# Patient Record
Sex: Female | Born: 1980 | Race: Black or African American | Hispanic: No | Smoking: Never smoker
Health system: Southern US, Community
[De-identification: ages and names within clinical notes are randomized; demographics above are authoritative.]

## PROBLEM LIST (undated history)

## (undated) DIAGNOSIS — Z8619 Personal history of other infectious and parasitic diseases: Secondary | ICD-10-CM

## (undated) DIAGNOSIS — D259 Leiomyoma of uterus, unspecified: Secondary | ICD-10-CM

## (undated) DIAGNOSIS — N946 Dysmenorrhea, unspecified: Secondary | ICD-10-CM

## (undated) HISTORY — PX: DILATION AND CURETTAGE OF UTERUS: SHX78

## (undated) HISTORY — DX: Personal history of other infectious and parasitic diseases: Z86.19

## (undated) HISTORY — DX: Dysmenorrhea, unspecified: N94.6

## (undated) HISTORY — DX: Leiomyoma of uterus, unspecified: D25.9

---

## 2010-12-29 ENCOUNTER — Emergency Department: Payer: Self-pay | Admitting: Unknown Physician Specialty

## 2011-01-15 ENCOUNTER — Emergency Department: Payer: Self-pay | Admitting: Emergency Medicine

## 2011-01-17 LAB — PATHOLOGY REPORT

## 2011-01-27 ENCOUNTER — Emergency Department: Payer: Self-pay | Admitting: Emergency Medicine

## 2011-06-06 ENCOUNTER — Emergency Department: Payer: Self-pay | Admitting: Emergency Medicine

## 2011-06-07 ENCOUNTER — Inpatient Hospital Stay: Payer: Self-pay | Admitting: Obstetrics and Gynecology

## 2011-06-11 LAB — PATHOLOGY REPORT

## 2011-06-30 ENCOUNTER — Ambulatory Visit: Payer: Self-pay | Admitting: Oncology

## 2011-07-01 ENCOUNTER — Ambulatory Visit: Payer: Self-pay | Admitting: Oncology

## 2011-07-26 ENCOUNTER — Ambulatory Visit: Payer: Self-pay | Admitting: Oncology

## 2011-09-10 ENCOUNTER — Ambulatory Visit: Payer: Self-pay | Admitting: Oncology

## 2011-09-18 ENCOUNTER — Ambulatory Visit: Payer: Self-pay | Admitting: Obstetrics and Gynecology

## 2011-09-18 LAB — BASIC METABOLIC PANEL
BUN: 5 mg/dL — ABNORMAL LOW (ref 7–18)
Co2: 27 mmol/L (ref 21–32)
EGFR (Non-African Amer.): >60
Glucose: 79 mg/dL (ref 65–99)
Osmolality: 276 (ref 275–301)
Potassium: 3.7 mmol/L (ref 3.5–5.1)

## 2011-09-18 LAB — HEMOGLOBIN: HGB: 12.8 g/dL (ref 12.0–16.0)

## 2011-09-26 ENCOUNTER — Ambulatory Visit: Payer: Self-pay | Admitting: Obstetrics and Gynecology

## 2011-09-26 ENCOUNTER — Ambulatory Visit: Payer: Self-pay | Admitting: Oncology

## 2011-10-18 ENCOUNTER — Ambulatory Visit: Payer: Self-pay | Admitting: Obstetrics and Gynecology

## 2012-01-29 ENCOUNTER — Emergency Department: Payer: Self-pay | Admitting: Emergency Medicine

## 2012-01-29 LAB — URINALYSIS, COMPLETE
Bacteria: NONE SEEN
Bilirubin,UR: NEGATIVE
Glucose,UR: NEGATIVE mg/dL (ref 0–75)
Leukocyte Esterase: NEGATIVE
Nitrite: NEGATIVE
Protein: NEGATIVE
Specific Gravity: 1.016 (ref 1.003–1.030)
Squamous Epithelial: 1

## 2012-01-29 LAB — CBC
HCT: 39.8 % (ref 35.0–47.0)
Platelet: 182 10*3/uL (ref 150–440)
RBC: 4.31 10*6/uL (ref 3.80–5.20)

## 2012-02-09 ENCOUNTER — Encounter: Payer: Self-pay | Admitting: Obstetrics and Gynecology

## 2012-02-16 ENCOUNTER — Emergency Department: Payer: Self-pay | Admitting: Emergency Medicine

## 2012-02-16 LAB — URINALYSIS, COMPLETE
Bilirubin,UR: NEGATIVE
Glucose,UR: NEGATIVE mg/dL (ref 0–75)
Ketone: NEGATIVE
Nitrite: NEGATIVE
Ph: 6 (ref 4.5–8.0)
Protein: 30
RBC,UR: 37 /HPF (ref 0–5)
Specific Gravity: 1.026 (ref 1.003–1.030)
Squamous Epithelial: 3
WBC UR: 4 /HPF (ref 0–5)

## 2012-02-16 LAB — CBC
HCT: 37.1 % (ref 35.0–47.0)
MCH: 31.2 pg (ref 26.0–34.0)
MCHC: 34 g/dL (ref 32.0–36.0)
MCV: 92 fL (ref 80–100)
Platelet: 168 10*3/uL (ref 150–440)
RDW: 13.4 % (ref 11.5–14.5)
WBC: 8.5 10*3/uL (ref 3.6–11.0)

## 2012-02-16 LAB — HCG, QUANTITATIVE, PREGNANCY: Beta Hcg, Quant.: 35482 m[IU]/mL — ABNORMAL HIGH

## 2012-04-25 ENCOUNTER — Observation Stay: Payer: Self-pay

## 2012-04-25 LAB — URINALYSIS, COMPLETE
Bilirubin,UR: NEGATIVE
Blood: NEGATIVE
Nitrite: NEGATIVE
Ph: 7 (ref 4.5–8.0)
Protein: NEGATIVE
RBC,UR: <1 /HPF (ref 0–5)
Squamous Epithelial: 3

## 2012-08-11 ENCOUNTER — Observation Stay: Payer: Self-pay | Admitting: Obstetrics and Gynecology

## 2012-08-19 ENCOUNTER — Inpatient Hospital Stay: Payer: Self-pay | Admitting: Obstetrics and Gynecology

## 2012-08-19 LAB — PIH PROFILE
Anion Gap: 10 (ref 7–16)
Calcium, Total: 9.1 mg/dL (ref 8.5–10.1)
Chloride: 107 mmol/L (ref 98–107)
Co2: 22 mmol/L (ref 21–32)
Creatinine: 0.65 mg/dL (ref 0.60–1.30)
EGFR (African American): >60
EGFR (Non-African Amer.): >60
Glucose: 86 mg/dL (ref 65–99)
HCT: 35.7 % (ref 35.0–47.0)
HGB: 11.9 g/dL — ABNORMAL LOW (ref 12.0–16.0)
MCHC: 33.4 g/dL (ref 32.0–36.0)
Osmolality: 275 (ref 275–301)
Platelet: 130 10*3/uL — ABNORMAL LOW (ref 150–440)
RBC: 3.91 10*6/uL (ref 3.80–5.20)
RDW: 15 % — ABNORMAL HIGH (ref 11.5–14.5)
SGOT(AST): 17 U/L (ref 15–37)
Uric Acid: 4.7 mg/dL (ref 2.6–6.0)
WBC: 7.9 10*3/uL (ref 3.6–11.0)

## 2013-05-29 ENCOUNTER — Emergency Department: Payer: Self-pay | Admitting: Emergency Medicine

## 2013-05-29 LAB — URINALYSIS, COMPLETE
Glucose,UR: NEGATIVE mg/dL (ref 0–75)
Protein: 100
RBC,UR: 380 /HPF (ref 0–5)
Specific Gravity: 1.028 (ref 1.003–1.030)
Squamous Epithelial: 65
WBC UR: 11 /HPF (ref 0–5)

## 2013-05-29 LAB — COMPREHENSIVE METABOLIC PANEL
Albumin: 3.7 g/dL (ref 3.4–5.0)
Bilirubin,Total: 0.3 mg/dL (ref 0.2–1.0)
Calcium, Total: 9.3 mg/dL (ref 8.5–10.1)
Chloride: 105 mmol/L (ref 98–107)
Co2: 20 mmol/L — ABNORMAL LOW (ref 21–32)
EGFR (African American): >60
EGFR (Non-African Amer.): >60
Osmolality: 274 (ref 275–301)
Potassium: 3.3 mmol/L — ABNORMAL LOW (ref 3.5–5.1)
SGOT(AST): 33 U/L (ref 15–37)
SGPT (ALT): 44 U/L (ref 12–78)
Sodium: 137 mmol/L (ref 136–145)

## 2013-05-29 LAB — CBC
HCT: 38.7 % (ref 35.0–47.0)
HGB: 13.3 g/dL (ref 12.0–16.0)
MCHC: 34.3 g/dL (ref 32.0–36.0)
MCV: 89 fL (ref 80–100)
Platelet: 199 10*3/uL (ref 150–440)

## 2013-05-29 LAB — LIPASE, BLOOD: Lipase: 70 U/L — ABNORMAL LOW (ref 73–393)

## 2013-11-19 IMAGING — US US OB < 14 WEEKS - US OB TV
1 series · 13 of 28 positions shown · non-contrast
Comparison: none

REASON FOR EXAM: left pelvic pain vaginal bleeding
COMMENTS:

PROCEDURE:     US  - US OB LESS THAN 14 WEEKS/W TRANS  - January 29, 2012  [DATE]
RESULT:     Emergent Pelvic Sonogram utilizing an early OB protocol is
performed.

[Series 1: us ob < 14 weeks - us ob tv · 0.25mm/px · 47 acquisitions, 13 frames shown]
[im 2/47]
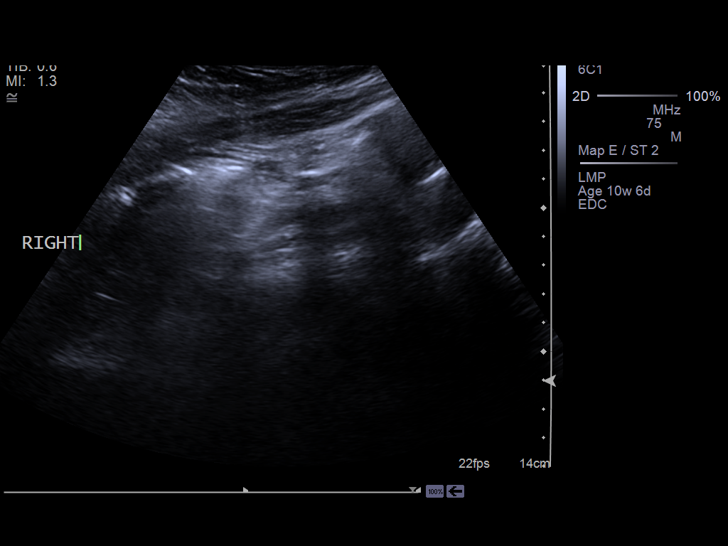
[im 6/47]
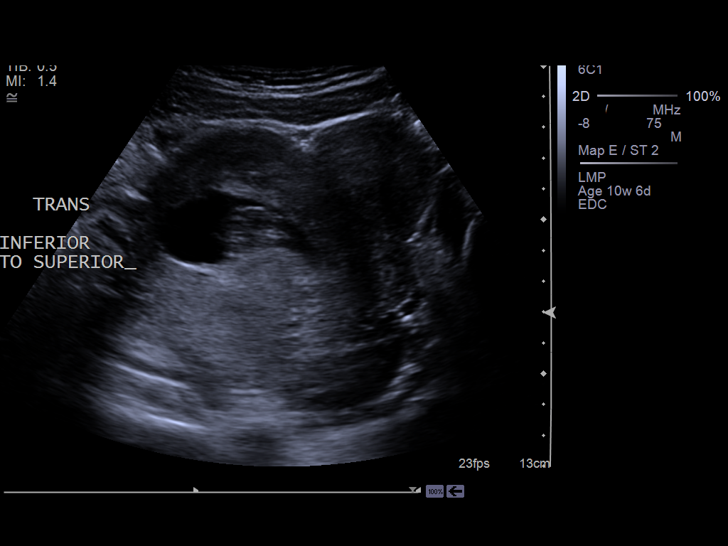
[im 9/47]
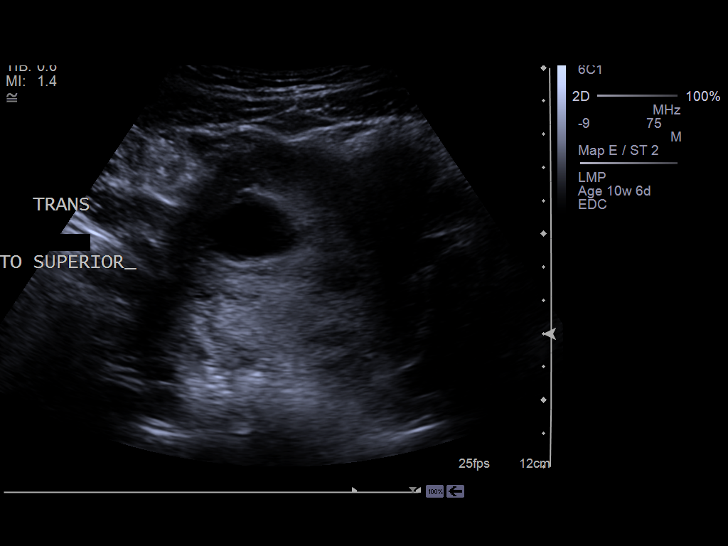
[im 12/47]
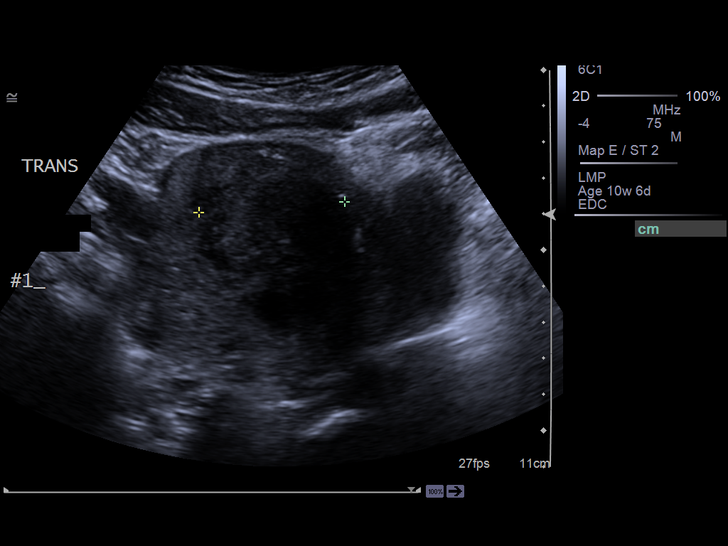
[im 16/47]
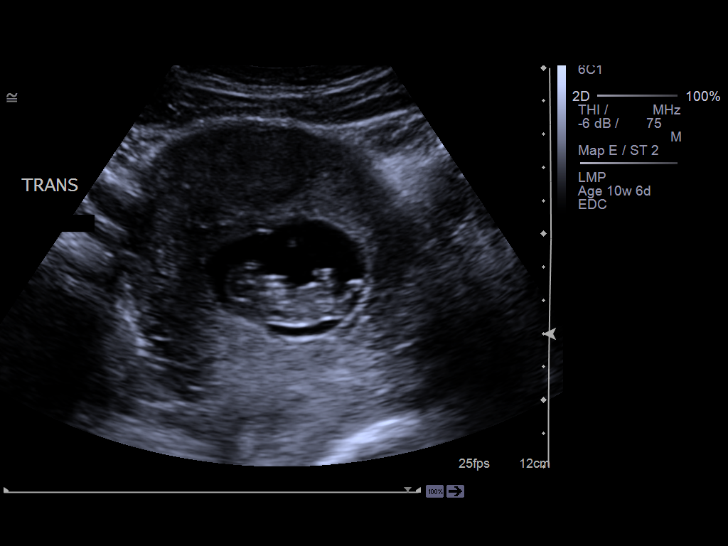
[im 19/47]
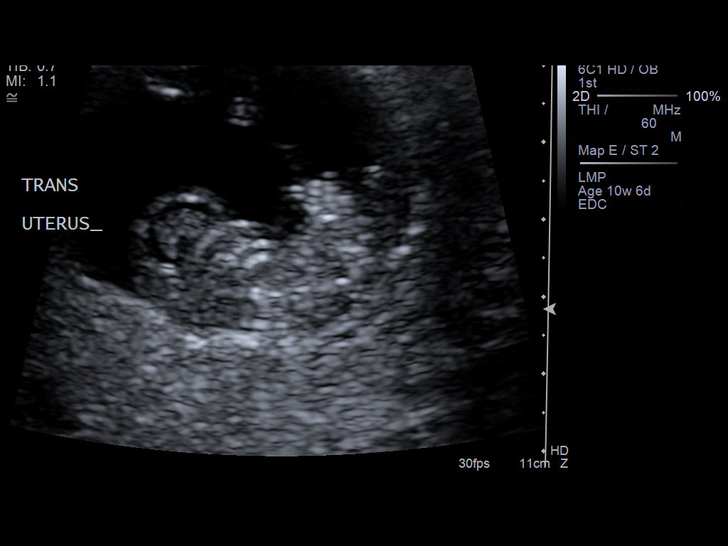
[im 24/47]
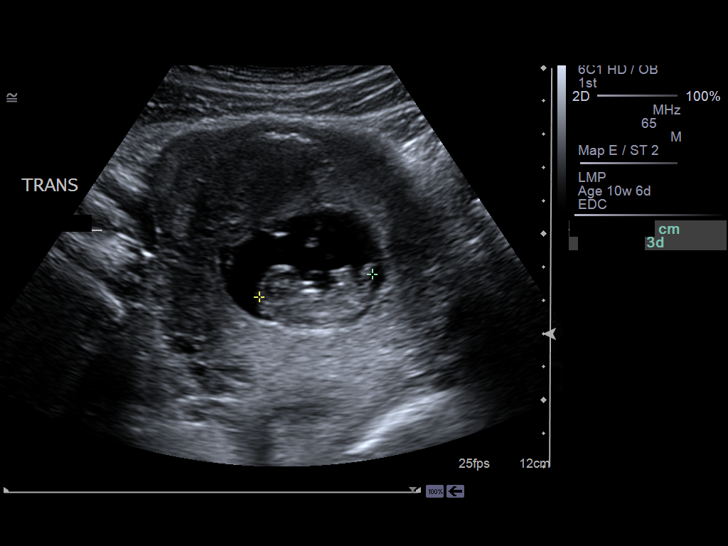
[im 28/47]
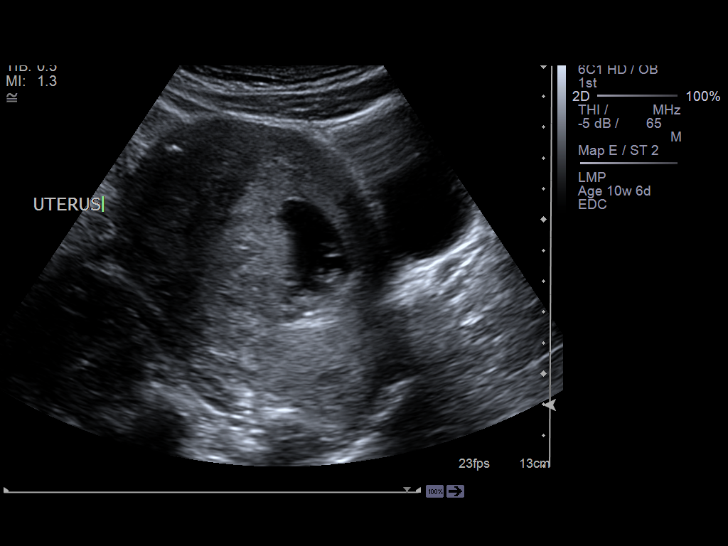
[im 31/47]
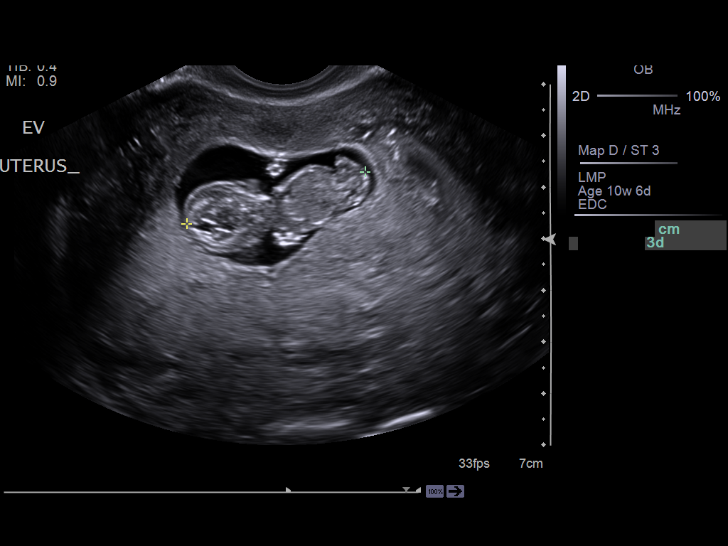
[im 35/47]
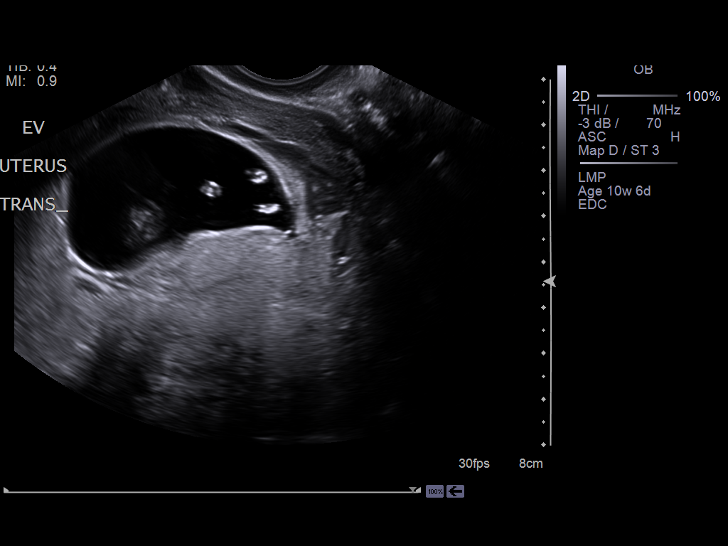
[im 38/47]
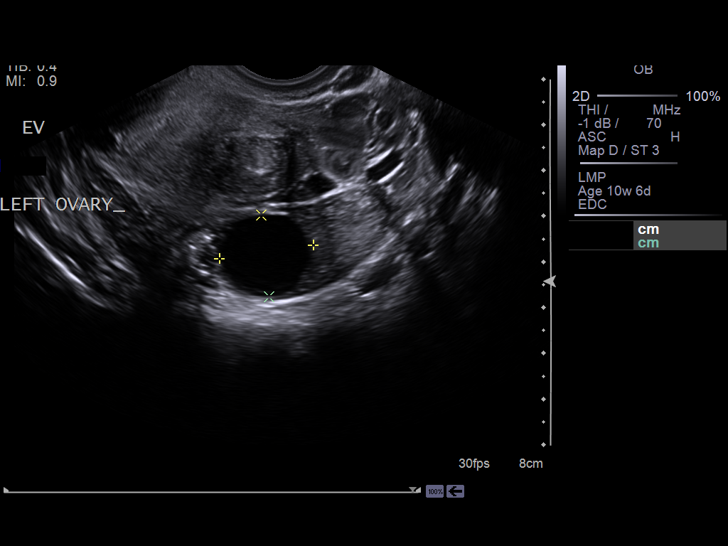
[im 41/47]
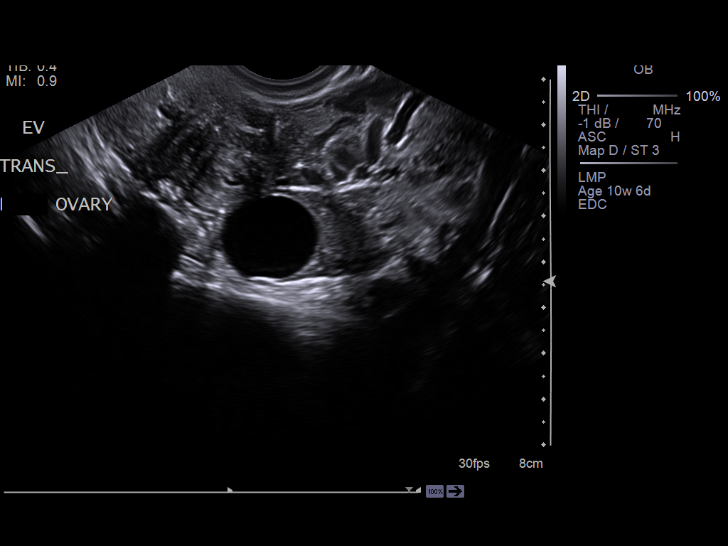
[im 45/47]
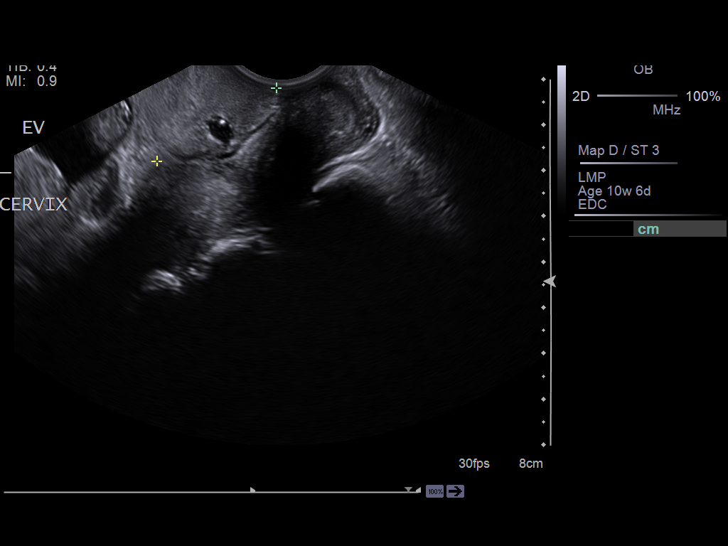

[13 of 28 positions shown; findings below may reference images not displayed]

FINDINGS: Images demonstrate an intrauterine gestational sac with evidence
of an intrauterine fetus. There is an abnormal area of echogenicity within
the uterus consistent with a large fibroid measuring up to 4.05 x 4.25 x
3.15 cm along the fundal region. In the left side of the uterus, there is a
larger fibroid measuring up to 4.77 x 3.80 x 4.0 cm. Fetal cardiac activity
is present with a rate of approximately 160 beats per minute. There is a
hypoechoic collection noted adjacent to the gestational sac consistent with
a small subchorionic hematoma measuring approximately 3.13 x 0.51 x 2.24 cm.
There is a left ovarian cyst of up to 2.1 cm. The right ovary is
unremarkable. There is no free fluid. Nabothian cyst is noted incidentally
in the cervix. Blood flow is documented to both ovaries. Crown-rump length
shows a mean measurement of 3.65 cm consistent with a 10 week, 3 day
gestation. Ultrasound EDC is 23 August, 2012.
IMPRESSION: 1.  Single, viable intrauterine fetus with estimated gestational age of 10
weeks, 3 days. Fetal cardiac activity is present.
2.  There is a small subchorionic hematoma present.
3.  Multiple uterine fibroids are present.
4.  Simple appearing cyst in the left ovary is present.

[REDACTED]

## 2014-01-22 ENCOUNTER — Emergency Department: Payer: Self-pay | Admitting: Emergency Medicine

## 2014-09-03 ENCOUNTER — Emergency Department: Payer: Self-pay | Admitting: Emergency Medicine

## 2014-12-15 NOTE — Discharge Summary (Signed)
PATIENT NAME:  Elizabeth Bridges, Elizabeth Bridges DATE OF BIRTH:  1981/07/25  ADMISSION DIAGNOSES:    This really does not require discharge; this is routine stuff, but she was admitted for [name],  induced and appears to have had an uncomplicated delivery with an uncomplicated postpartum course, not requiring medications to control  blood pressure at discharge.     ____________________________ Audry Pili L. Amalia Hailey, MD rle:ct D: 09/01/2012 08:08:00 ET T: 09/01/2012 10:17:58 ET JOB#: [id number]  cc: Ricky L. Amalia Hailey, MD, <Dictator> Selmer Dominion MD ELECTRONICALLY SIGNED 09/03/2012 17:43

## 2014-12-17 NOTE — Op Note (Signed)
PATIENT NAME:  Elizabeth Bridges, Elizabeth Bridges MR#:  [id number] DATE OF BIRTH:  [birth year]  DATE OF PROCEDURE:  09/26/2011  PREOPERATIVE DIAGNOSIS: Submucosal fibroid.   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   PROCEDURE: Hysteroscopic evaluation of the endometrial cavity.   SURGEON: Boykin Nearing, MD  ANESTHESIA: General endotracheal anesthesia.   INDICATIONS: A 34 year old gravida 3, para 1 patient with a history of 15 week second trimester fetal loss. Saline infusion sonohysterography demonstrates two separate submucosal fibroids.   DESCRIPTION OF PROCEDURE: After adequate general endotracheal anesthesia, patient was placed in dorsal supine position, legs in the candy cane stirrups. Lower abdomen, perineal and vaginal prep performed with Betadine. Patient was sterilely draped. Patient did receive 2 grams IV cefoxitin prior to commencement of the case. Weighted speculum placed in the posterior vaginal vault after draining the bladder with a Red Robinson catheter yielding 100 mL urine. Anterior cervix grasped with a single-tooth tenaculum. Uterus is irregularly shaped, midplane. The cervix was dilated to #20 Hanks dilator without difficulty. The resectoscope advanced into the endometrial cavity without difficulty with 1.5% glycine used as distending medium. Upon entry into the endometrial cavity the right ostia and horn was visualized. There seemed to be a thickened midline septum and the left ostia was not identified. The endometrial cavity was luteal phase and thickened. Multiple attempts to identify left ostia unsuccessful. 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 Procedure was terminated. Total glycine use 1300 mL, net remained within the patient 270 mL glycine. No complications. Patient tolerated procedure well. Procedure was terminated. Single-tooth tenaculum removed  from the anterior cervix. No bleeding. Good hemostasis noted.  ____________________________ Boykin Nearing, MD tjs:cms D: 09/26/2011 08:45:31 ET T: 09/26/2011 12:06:53 ET  JOB#: [id number] Boykin Nearing MD ELECTRONICALLY SIGNED 10/01/2011 9:24

## 2015-01-02 NOTE — H&P (Signed)
L&D Evaluation:  History:   HPI [id number] Obese BF EDC 08/20/12    Presents with elevated BP 151/96 at office    Patient's Medical History No Chronic Illness    Patient's Surgical History D&C    Medications Pre Natal Vitamins    Allergies NKDA    Social History none    Family History Non-Contributory   ROS:   ROS All systems were reviewed.  HEENT, CNS, GI, GU, Respiratory, CV, Renal and Musculoskeletal systems were found to be normal., specifically denies H/A, N/V, visual changes   Exam:   Vital Signs stable    General no apparent distress    Abdomen gravid, non-tender    Estimated Fetal Weight Average for gestational age    Back no CVAT    Reflexes 1+    Pelvic 4/80/-1, VTX    Mebranes AROM clear    FHT normal rate with no decels    Ucx absent   Impression:   Impression PIH   Plan:   Comments induce preeclampsia labs walk for 1 hour after 15 min EFM post-rupture Pit if no increase UC's after 1 hr   Electronic Signatures: Edison Nasuti (MD)  (Signed 26-Dec-13 17:54)  Authored: L&D Evaluation   Last Updated: 26-Dec-13 17:54 by Edison Nasuti (MD)

## 2015-01-02 NOTE — H&P (Signed)
L&D Evaluation:  History:   HPI 34yo [id number] with LMP of 11/14/11 & EDD of 08/20/12 with [place] at [facility] significant for "pain in in the lower pelvic area which feels like a 6 on 1-10 scale". Pt states the "pain started yest afternoon and felt like a dull pain and now feels more like a sharp pain". Denies any UC's, decreased FM, Vag bleeding, pain has not resolved and pt is concerned as she had a fetal loss in Oct 2012.    Presents with pelvic pain    Patient's Medical History Uterine fibroids x 2, ASCUs, subchorionic hematoma, 15 week loss, Protein S def,    Patient's Surgical History Lap surgery    Medications Pre Natal Vitamins    Allergies NKDA    Social History none    Family History Non-Contributory   ROS:   ROS All systems were reviewed.  HEENT, CNS, GI, GU, Respiratory, CV, Renal and Musculoskeletal systems were found to be normal.   Exam:   Vital Signs stable    General no apparent distress    Mental Status clear    Chest clear    Heart normal sinus rhythm, no murmur/gallop/rubs    Abdomen gravid, non-tender    Estimated Fetal Weight Average for gestational age    Back no CVAT    Edema 1+    Reflexes 1+    Clonus negative    Pelvic cervix closed and thick    Mebranes Intact    FHT normal rate with no decels    Ucx absent    Skin dry    Lymph no lymphadenopathy   Impression:   Impression IUP at 23 6/7 weeks w/ lower pelvic pain   Plan:   Plan Cont to observe.    Comments Urine is ess neg with 1+ luke esterase. Will do urine C&S.   Electronic Signatures: Catheryn Bacon (CNM)  (Signed 01-Sep-13 22:21)  Authored: L&D Evaluation   Last Updated: 01-Sep-13 22:21 by Catheryn Bacon (CNM)

## 2016-01-29 ENCOUNTER — Ambulatory Visit (INDEPENDENT_AMBULATORY_CARE_PROVIDER_SITE_OTHER): Payer: BLUE CROSS/BLUE SHIELD | Admitting: Obstetrics and Gynecology

## 2016-01-29 ENCOUNTER — Encounter: Payer: Self-pay | Admitting: Obstetrics and Gynecology

## 2016-01-29 VITALS — BP 104/70 | HR 76 | Ht 65.0 in | Wt 216.8 lb

## 2016-01-29 DIAGNOSIS — Z113 Encounter for screening for infections with a predominantly sexual mode of transmission: Secondary | ICD-10-CM | POA: Diagnosis not present

## 2016-01-29 DIAGNOSIS — D259 Leiomyoma of uterus, unspecified: Secondary | ICD-10-CM

## 2016-01-29 DIAGNOSIS — E669 Obesity, unspecified: Secondary | ICD-10-CM

## 2016-01-29 DIAGNOSIS — Z01419 Encounter for gynecological examination (general) (routine) without abnormal findings: Secondary | ICD-10-CM

## 2016-01-29 DIAGNOSIS — N920 Excessive and frequent menstruation with regular cycle: Secondary | ICD-10-CM | POA: Diagnosis not present

## 2016-01-29 LAB — POCT URINALYSIS DIPSTICK
BILIRUBIN UA: NEGATIVE
Glucose, UA: NEGATIVE
KETONES UA: NEGATIVE
Nitrite, UA: NEGATIVE
PH UA: 6
Urobilinogen, UA: NEGATIVE

## 2016-01-29 MED ORDER — DROSPIRENONE-ETHINYL ESTRADIOL 3-0.03 MG PO TABS: 1.0000 | ORAL_TABLET | Freq: Every day | ORAL | Status: DC

## 2016-01-29 NOTE — Patient Instructions (Signed)
Health Maintenance, Female Adopting a healthy lifestyle and getting preventive care can go a long way to promote health and wellness. Talk with your health care provider about what schedule of regular examinations is right for you. This is a good chance for you to check in with your provider about disease prevention and staying healthy. In between checkups, there are plenty of things you can do on your own. Experts have done a lot of research about which lifestyle changes and preventive measures are most likely to keep you healthy. Ask your health care provider for more information. WEIGHT AND DIET  Eat a healthy diet  Be sure to include plenty of vegetables, fruits, low-fat dairy products, and lean protein.  Do not eat a lot of foods high in solid fats, added sugars, or salt.  Get regular exercise. This is one of the most important things you can do for your health.  Most adults should exercise for at least 150 minutes each week. The exercise should increase your heart rate and make you sweat (moderate-intensity exercise).  Most adults should also do strengthening exercises at least twice a week. This is in addition to the moderate-intensity exercise.  Maintain a healthy weight  Body mass index (BMI) is a measurement that can be used to identify possible weight problems. It estimates body fat based on height and weight. Your health care provider can help determine your BMI and help you achieve or maintain a healthy weight.  For females 20 years of age and older:   A BMI below 18.5 is considered underweight.  A BMI of 18.5 to 24.9 is normal.  A BMI of 25 to 29.9 is considered overweight.  A BMI of 30 and above is considered obese.  Watch levels of cholesterol and blood lipids  You should start having your blood tested for lipids and cholesterol at 35 years of age, then have this test every 5 years.  You may need to have your cholesterol levels checked more often if:  Your lipid  or cholesterol levels are high.  You are older than 35 years of age.  You are at high risk for heart disease.  CANCER SCREENING   Lung Cancer  Lung cancer screening is recommended for adults 55-80 years old who are at high risk for lung cancer because of a history of smoking.  A yearly low-dose CT scan of the lungs is recommended for people who:  Currently smoke.  Have quit within the past 15 years.  Have at least a 30-pack-year history of smoking. A pack year is smoking an average of one pack of cigarettes a day for 1 year.  Yearly screening should continue until it has been 15 years since you quit.  Yearly screening should stop if you develop a health problem that would prevent you from having lung cancer treatment.  Breast Cancer  Practice breast self-awareness. This means understanding how your breasts normally appear and feel.  It also means doing regular breast self-exams. Let your health care provider know about any changes, no matter how small.  If you are in your 20s or 30s, you should have a clinical breast exam (CBE) by a health care provider every 1-3 years as part of a regular health exam.  If you are 40 or older, have a CBE every year. Also consider having a breast X-ray (mammogram) every year.  If you have a family history of breast cancer, talk to your health care provider about genetic screening.  If you   are at high risk for breast cancer, talk to your health care provider about having an MRI and a mammogram every year.  Breast cancer gene (BRCA) assessment is recommended for women who have family members with BRCA-related cancers. BRCA-related cancers include:  Breast.  Ovarian.  Tubal.  Peritoneal cancers.  Results of the assessment will determine the need for genetic counseling and BRCA1 and BRCA2 testing. Cervical Cancer Your health care provider may recommend that you be screened regularly for cancer of the pelvic organs (ovaries, uterus, and  vagina). This screening involves a pelvic examination, including checking for microscopic changes to the surface of your cervix (Pap test). You may be encouraged to have this screening done every 3 years, beginning at age 21.  For women ages 30-65, health care providers may recommend pelvic exams and Pap testing every 3 years, or they may recommend the Pap and pelvic exam, combined with testing for human papilloma virus (HPV), every 5 years. Some types of HPV increase your risk of cervical cancer. Testing for HPV may also be done on women of any age with unclear Pap test results.  Other health care providers may not recommend any screening for nonpregnant women who are considered low risk for pelvic cancer and who do not have symptoms. Ask your health care provider if a screening pelvic exam is right for you.  If you have had past treatment for cervical cancer or a condition that could lead to cancer, you need Pap tests and screening for cancer for at least 20 years after your treatment. If Pap tests have been discontinued, your risk factors (such as having a new sexual partner) need to be reassessed to determine if screening should resume. Some women have medical problems that increase the chance of getting cervical cancer. In these cases, your health care provider may recommend more frequent screening and Pap tests. Colorectal Cancer  This type of cancer can be detected and often prevented.  Routine colorectal cancer screening usually begins at 35 years of age and continues through 35 years of age.  Your health care provider may recommend screening at an earlier age if you have risk factors for colon cancer.  Your health care provider may also recommend using home test kits to check for hidden blood in the stool.  A small camera at the end of a tube can be used to examine your colon directly (sigmoidoscopy or colonoscopy). This is done to check for the earliest forms of colorectal  cancer.  Routine screening usually begins at age 50.  Direct examination of the colon should be repeated every 5-10 years through 35 years of age. However, you may need to be screened more often if early forms of precancerous polyps or small growths are found. Skin Cancer  Check your skin from head to toe regularly.  Tell your health care provider about any new moles or changes in moles, especially if there is a change in a mole's shape or color.  Also tell your health care provider if you have a mole that is larger than the size of a pencil eraser.  Always use sunscreen. Apply sunscreen liberally and repeatedly throughout the day.  Protect yourself by wearing long sleeves, pants, a wide-brimmed hat, and sunglasses whenever you are outside. HEART DISEASE, DIABETES, AND HIGH BLOOD PRESSURE   High blood pressure causes heart disease and increases the risk of stroke. High blood pressure is more likely to develop in:  People who have blood pressure in the high end   of the normal range (130-139/85-89 mm Hg).  People who are overweight or obese.  People who are African American.  If you are 38-23 years of age, have your blood pressure checked every 3-5 years. If you are 61 years of age or older, have your blood pressure checked every year. You should have your blood pressure measured twice--once when you are at a hospital or clinic, and once when you are not at a hospital or clinic. Record the average of the two measurements. To check your blood pressure when you are not at a hospital or clinic, you can use:  An automated blood pressure machine at a pharmacy.  A home blood pressure monitor.  If you are between 45 years and 39 years old, ask your health care provider if you should take aspirin to prevent strokes.  Have regular diabetes screenings. This involves taking a blood sample to check your fasting blood sugar level.  If you are at a normal weight and have a low risk for diabetes,  have this test once every three years after 35 years of age.  If you are overweight and have a high risk for diabetes, consider being tested at a younger age or more often. PREVENTING INFECTION  Hepatitis B  If you have a higher risk for hepatitis B, you should be screened for this virus. You are considered at high risk for hepatitis B if:  You were born in a country where hepatitis B is common. Ask your health care provider which countries are considered high risk.  Your parents were born in a high-risk country, and you have not been immunized against hepatitis B (hepatitis B vaccine).  You have HIV or AIDS.  You use needles to inject street drugs.  You live with someone who has hepatitis B.  You have had sex with someone who has hepatitis B.  You get hemodialysis treatment.  You take certain medicines for conditions, including cancer, organ transplantation, and autoimmune conditions. Hepatitis C  Blood testing is recommended for:  Everyone born from [birth year] through 1965.  Anyone with known risk factors for hepatitis C. Sexually transmitted infections (STIs)  You should be screened for sexually transmitted infections (STIs) including gonorrhea and chlamydia if:  You are sexually active and are younger than 35 years of age.  You are older than 35 years of age and your health care provider tells you that you are at risk for this type of infection.  Your sexual activity has changed since you were last screened and you are at an increased risk for chlamydia or gonorrhea. Ask your health care provider if you are at risk.  If you do not have HIV, but are at risk, it may be recommended that you take a prescription medicine daily to prevent HIV infection. This is called pre-exposure prophylaxis (PrEP). You are considered at risk if:  You are sexually active and do not regularly use condoms or know the HIV status of your partner(s).  You take drugs by injection.  You are sexually  active with a partner who has HIV. Talk with your health care provider about whether you are at high risk of being infected with HIV. If you choose to begin PrEP, you should first be tested for HIV. You should then be tested every 3 months for as long as you are taking PrEP.  PREGNANCY   If you are premenopausal and you may become pregnant, ask your health care provider about preconception counseling.  If you may  become pregnant, take 400 to 800 micrograms (mcg) of folic acid every day.  If you want to prevent pregnancy, talk to your health care provider about birth control (contraception). OSTEOPOROSIS AND MENOPAUSE   Osteoporosis is a disease in which the bones lose minerals and strength with aging. This can result in serious bone fractures. Your risk for osteoporosis can be identified using a bone density scan.  If you are 61 years of age or older, or if you are at risk for osteoporosis and fractures, ask your health care provider if you should be screened.  Ask your health care provider whether you should take a calcium or vitamin D supplement to lower your risk for osteoporosis.  Menopause may have certain physical symptoms and risks.  Hormone replacement therapy may reduce some of these symptoms and risks. Talk to your health care provider about whether hormone replacement therapy is right for you.  HOME CARE INSTRUCTIONS   Schedule regular health, dental, and eye exams.  Stay current with your immunizations.   Do not use any tobacco products including cigarettes, chewing tobacco, or electronic cigarettes.  If you are pregnant, do not drink alcohol.  If you are breastfeeding, limit how much and how often you drink alcohol.  Limit alcohol intake to no more than 1 drink per day for nonpregnant women. One drink equals 12 ounces of beer, 5 ounces of wine, or 1 ounces of hard liquor.  Do not use street drugs.  Do not share needles.  Ask your health care provider for help if  you need support or information about quitting drugs.  Tell your health care provider if you often feel depressed.  Tell your health care provider if you have ever been abused or do not feel safe at home.   This information is not intended to replace advice given to you by your health care provider. Make sure you discuss any questions you have with your health care provider.   Document Released: 02/24/2011 Document Revised: 09/01/2014 Document Reviewed: 07/13/2013 Elsevier Interactive Patient Education [employer].

## 2016-01-31 ENCOUNTER — Encounter: Payer: Self-pay | Admitting: Obstetrics and Gynecology

## 2016-01-31 NOTE — Progress Notes (Signed)
GYNECOLOGY ANNUAL PHYSICAL EXAM PROGRESS NOTE  Subjective:    Elizabeth Bridges is a 35 y.o. P65 female who presents for an annual exam. The patient has no complaints today. The patient is sexually active.  The patient wears seatbelts: yes. The patient participates in regular exercise: no. Has the patient ever been transfused or tattooed?: no. The patient reports that there is not domestic violence in her life.   The patient desires to discuss contraception.  Is currently on an OCP, but notes that it is no longer helping to control heavy menses due to fibroids.   Gynecologic History Menarche age: 43  Patient's last menstrual period was 01/22/2016 Contraception: OCP (estrogen/progesterone) History of STI's: Trichomoniasis, 2016. Last Pap: 10/2014. Results were: normal.  Denies h/o abnormal pap smears.   Obstetric History   G4   P2   T2   P0   A2   TAB0   SAB2   E0   M0   L3     # Outcome Date GA Lbr Len/2nd Weight Sex Delivery Anes PTL Lv  4 Term 2013   6 lb 4 oz (2.835 kg) M Vag-Spont     3 Term 2003   6 lb 12 oz (3.062 kg) F Vag-Spont   Y  2 SAB           1 SAB               Past Medical History  Diagnosis Date  . History of trichomoniasis   . Uterine fibroid   . Dysmenorrhea     Past Surgical History  Procedure Laterality Date  . Dilation and curettage of uterus      x 2    Family History  Problem Relation Age of Onset  . Asthma Mother   . Hypertension Father   . Heart disease Father   . Diabetes Father     Social History   Social History  . Marital Status: Single    Spouse Name: N/A  . Number of Children: N/A  . Years of Education: N/A   Occupational History  . Not on file.   Social History Main Topics  . Smoking status: Never Smoker   . Smokeless tobacco: Not on file  . Alcohol Use: No  . Drug Use: No  . Sexual Activity: Yes    Birth Control/ Protection: None   Other Topics Concern  . Not on file   Social History Narrative  . No  narrative on file    Outpatient Encounter Prescriptions as of 01/29/2016  Medication Sig Note  . ORSYTHIA 0.1-20 MG-MCG tablet Take 1 tablet by mouth daily. Reported on 01/29/2016 01/29/2016: Received from: External Pharmacy   No facility-administered encounter medications on file as of 01/29/2016.     No Known Allergies    Review of Systems Constitutional: negative for chills, fatigue, fevers and sweats Eyes: negative for irritation, redness and visual disturbance Ears, nose, mouth, throat, and face: negative for hearing loss, nasal congestion, snoring and tinnitus Respiratory: negative for asthma, cough, sputum Cardiovascular: negative for chest pain, dyspnea, exertional chest pressure/discomfort, irregular heart beat, palpitations and syncope Gastrointestinal: negative for abdominal pain, change in bowel habits, nausea and vomiting Genitourinary: positive for heavy menses (however regularly occuring).  Negative for genital lesions, sexual problems and vaginal discharge, dysuria and urinary incontinence Integument/breast: negative for breast lump, breast tenderness and nipple discharge Hematologic/lymphatic: negative for bleeding and easy bruising Musculoskeletal:negative for back pain and muscle weakness Neurological: negative  for dizziness, headaches, vertigo and weakness Endocrine: negative for diabetic symptoms including polydipsia, polyuria and skin dryness Allergic/Immunologic: negative for hay fever and urticaria       Objective:  Blood pressure 104/70, pulse 76, height 5\' 5"  (1.651 m), weight 216 lb 12.8 oz (98.34 kg), last menstrual period 01/22/2016. Body mass index is 36.08 kg/(m^2).  General Appearance:    Alert, cooperative, no distress, appears stated age, moderately obese  Head:    Normocephalic, without obvious abnormality, atraumatic  Eyes:    PERRL, conjunctiva/corneas clear, EOM's intact, both eyes  Ears:    Normal external ear canals, both ears  Nose:   Nares  normal, septum midline, mucosa normal, no drainage or sinus tenderness  Throat:   Lips, mucosa, and tongue normal; teeth and gums normal  Neck:   Supple, symmetrical, trachea midline, no adenopathy; thyroid: no enlargement/tenderness/nodules; no carotid bruit or JVD  Back:     Symmetric, no curvature, ROM normal, no CVA tenderness  Lungs:     Clear to auscultation bilaterally, respirations unlabored  Chest Wall:    No tenderness or deformity   Heart:    Regular rate and rhythm, S1 and S2 normal, no murmur, rub or gallop  Breast Exam:    No tenderness, masses, or nipple abnormality  Abdomen:     Soft, non-tender, bowel sounds active all four quadrants, no masses, no organomegaly.    Genitalia:    Pelvic:external genitalia normal, vagina without lesions, discharge, or tenderness, rectovaginal septum  normal. Cervix normal in appearance, no cervical motion tenderness, no adnexal masses or tenderness.  Uterus normal size, shape, mobile, regular contours, nontender.  Rectal:    Normal external sphincter.  No hemorrhoids appreciated. Internal exam not done.   Extremities:   Extremities normal, atraumatic, no cyanosis or edema  Pulses:   2+ and symmetric all extremities  Skin:   Skin color, texture, turgor normal, no rashes or lesions  Lymph nodes:   Cervical, supraclavicular, and axillary nodes normal  Neurologic:   CNII-XII intact, normal strength, sensation and reflexes throughout   .  Labs:  Lab Results  Component Value Date   WBC 12.4* 05/29/2013   HGB 13.3 05/29/2013   HCT 38.7 05/29/2013   MCV 89 05/29/2013   PLT 199 05/29/2013    Lab Results  Component Value Date   CREATININE 0.94 05/29/2013   BUN 8 05/29/2013   NA 137 05/29/2013   K 3.3* 05/29/2013   CL 105 05/29/2013   CO2 20* 05/29/2013    Lab Results  Component Value Date   ALT 44 05/29/2013   AST 33 05/29/2013   ALKPHOS 90 05/29/2013   BILITOT 0.3 05/29/2013    No results found for: TSH   Assessment:    Healthy female exam.  H/o fibroid uterus Heavy menses Obesity, Class II  Plan:    - Blood tests: CBC with diff, Comprehensive metabolic panel, Lipoproteins and Vitamin D levels. - Breast self exam technique reviewed and patient encouraged to perform self-exam monthly. - Contraception: OCP (estrogen/progesterone).  Will change from [place] to [place]. Patient at age 41, however can continue taking combined OCPs as she is not a smoker, no h/o HTN or CVD.  - Discussed healthy lifestyle modifications. - Pap smear performed today.  - Patient desires STI screening. Will perform.  - RTC in 1 year, or as needed.     Rubie Maid, MD [facility]

## 2016-02-01 ENCOUNTER — Other Ambulatory Visit: Payer: BLUE CROSS/BLUE SHIELD

## 2016-02-01 DIAGNOSIS — Z01419 Encounter for gynecological examination (general) (routine) without abnormal findings: Secondary | ICD-10-CM | POA: Diagnosis not present

## 2016-02-02 LAB — CBC WITH DIFFERENTIAL/PLATELET
BASOS: 0 %
Basophils Absolute: 0 10*3/uL (ref 0.0–0.2)
EOS (ABSOLUTE): 0.1 10*3/uL (ref 0.0–0.4)
Eos: 1 %
HEMATOCRIT: 35.8 % (ref 34.0–46.6)
Hemoglobin: 12.3 g/dL (ref 11.1–15.9)
Immature Grans (Abs): 0 10*3/uL (ref 0.0–0.1)
Immature Granulocytes: 0 %
LYMPHS ABS: 1.9 10*3/uL (ref 0.7–3.1)
Lymphs: 27 %
MCH: 29.8 pg (ref 26.6–33.0)
MCHC: 34.4 g/dL (ref 31.5–35.7)
MCV: 87 fL (ref 79–97)
MONOS ABS: 0.5 10*3/uL (ref 0.1–0.9)
Monocytes: 8 %
NEUTROS ABS: 4.3 10*3/uL (ref 1.4–7.0)
NEUTROS PCT: 64 %
PLATELETS: 244 10*3/uL (ref 150–379)
RBC: 4.13 x10E6/uL (ref 3.77–5.28)
RDW: 12.9 % (ref 12.3–15.4)
WBC: 6.9 10*3/uL (ref 3.4–10.8)

## 2016-02-02 LAB — COMPREHENSIVE METABOLIC PANEL
A/G RATIO: 1.2 (ref 1.2–2.2)
ALBUMIN: 3.8 g/dL (ref 3.5–5.5)
ALT: 19 IU/L (ref 0–32)
AST: 22 IU/L (ref 0–40)
Alkaline Phosphatase: 78 IU/L (ref 39–117)
BUN / CREAT RATIO: 14 (ref 9–23)
BUN: 10 mg/dL (ref 6–20)
Bilirubin Total: 0.3 mg/dL (ref 0.0–1.2)
CALCIUM: 9.5 mg/dL (ref 8.7–10.2)
CO2: 24 mmol/L (ref 18–29)
Chloride: 100 mmol/L (ref 96–106)
Creatinine, Ser: 0.74 mg/dL (ref 0.57–1.00)
GFR, EST AFRICAN AMERICAN: 121 mL/min/{1.73_m2} (ref >59)
GFR, EST NON AFRICAN AMERICAN: 105 mL/min/{1.73_m2} (ref >59)
GLOBULIN, TOTAL: 3.2 g/dL (ref 1.5–4.5)
Glucose: 84 mg/dL (ref 65–99)
POTASSIUM: 3.9 mmol/L (ref 3.5–5.2)
SODIUM: 141 mmol/L (ref 134–144)
TOTAL PROTEIN: 7 g/dL (ref 6.0–8.5)

## 2016-02-02 LAB — HEP, RPR, HIV PANEL
HIV Screen 4th Generation wRfx: NONREACTIVE
Hepatitis B Surface Ag: NEGATIVE
RPR: NONREACTIVE

## 2016-02-02 LAB — LIPID PANEL
CHOL/HDL RATIO: 3.1 ratio (ref 0.0–4.4)
Cholesterol, Total: 183 mg/dL (ref 100–199)
HDL: 59 mg/dL (ref >39)
LDL Calculated: 107 mg/dL — ABNORMAL HIGH (ref 0–99)
TRIGLYCERIDES: 85 mg/dL (ref 0–149)
VLDL Cholesterol Cal: 17 mg/dL (ref 5–40)

## 2016-02-02 LAB — VITAMIN D 25 HYDROXY (VIT D DEFICIENCY, FRACTURES): Vit D, 25-Hydroxy: 11.3 ng/mL — ABNORMAL LOW (ref 30.0–100.0)

## 2016-02-07 ENCOUNTER — Telehealth: Payer: Self-pay

## 2016-02-07 DIAGNOSIS — E559 Vitamin D deficiency, unspecified: Secondary | ICD-10-CM

## 2016-02-07 MED ORDER — VITAMIN D (ERGOCALCIFEROL) 1.25 MG (50000 UNIT) PO CAPS: 50000.0000 [IU] | ORAL_CAPSULE | ORAL | Status: DC

## 2016-02-07 NOTE — Telephone Encounter (Signed)
-----   Message from Rubie Maid, MD sent at 02/05/2016  6:53 PM EDT ----- Normal annual labs except:  1) LDL cholesterol slightly elevated.  Should engage in lifestyle changes (diet modification (low cholesterol foods) and exercise at least 3 x weekly).  2) Vit D levels very low, with Vit D deficiency.  Needs to begin Vit D 50,000 units weekly x 12 weeks. Then should begin daily OTC Vit D supplement.  Repeat levels in 3 months after initiation of treatment.

## 2016-02-07 NOTE — Telephone Encounter (Signed)
Called pt informed her of labs and prescription. RX sent in pt to follow up in 12wks.

## 2016-05-02 ENCOUNTER — Other Ambulatory Visit: Payer: BLUE CROSS/BLUE SHIELD

## 2016-05-02 ENCOUNTER — Other Ambulatory Visit: Payer: Self-pay | Admitting: Obstetrics and Gynecology

## 2016-05-02 DIAGNOSIS — E559 Vitamin D deficiency, unspecified: Secondary | ICD-10-CM | POA: Diagnosis not present

## 2016-05-03 LAB — VITAMIN D 25 HYDROXY (VIT D DEFICIENCY, FRACTURES): VIT D 25 HYDROXY: 34.8 ng/mL (ref 30.0–100.0)

## 2016-06-24 IMAGING — CR RIGHT FOOT COMPLETE - 3+ VIEW
1 series · 3 of 3 positions shown · non-contrast
Comparison: None.

CLINICAL DATA: Hit toes on dresser while catching son from falling
from bed this a.m..

EXAM:
RIGHT FOOT COMPLETE - 3+ VIEW

[Series 1: dxr foot rt complete w/obliques · 0.14mm/px · 3 of 3 slices shown]
[im 1/3]
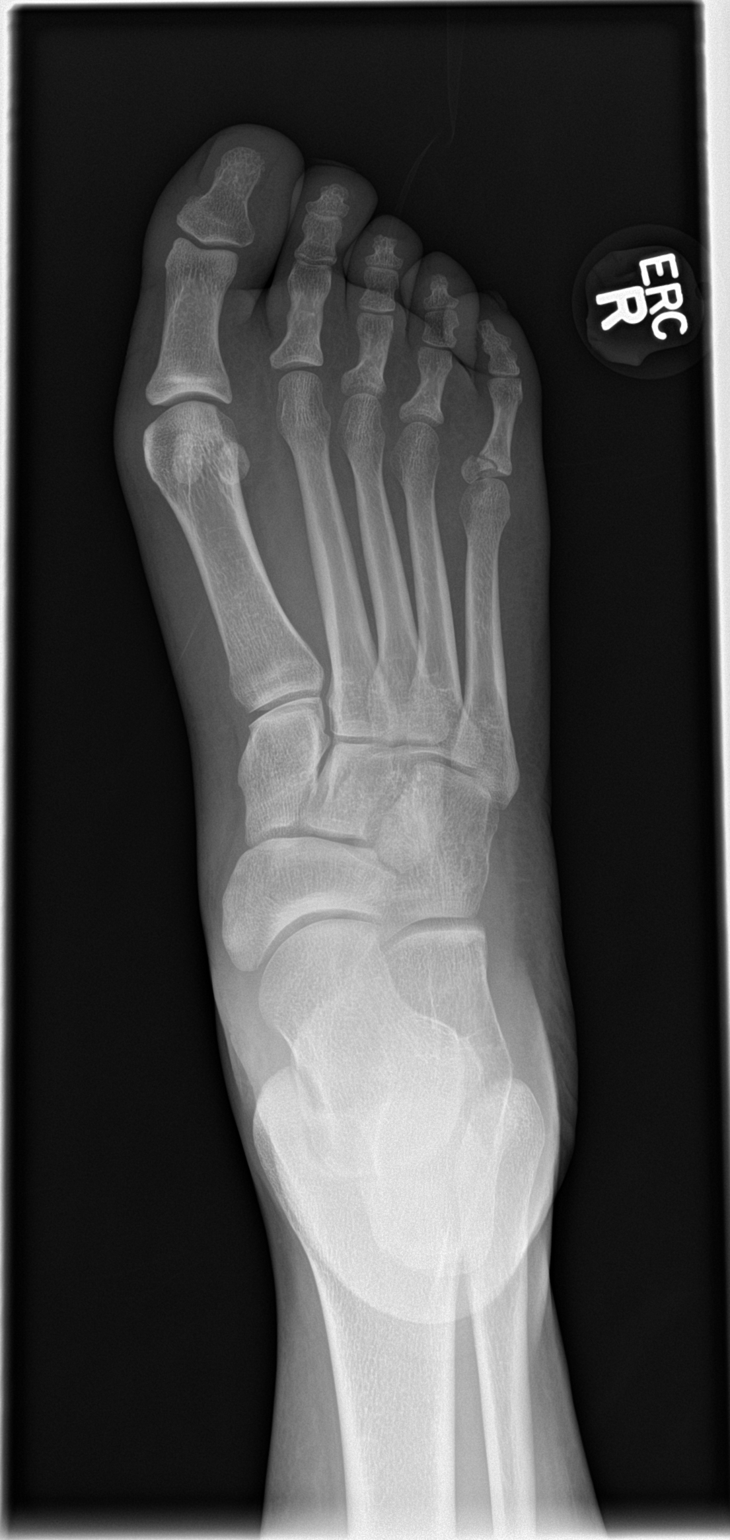
[im 2/3]
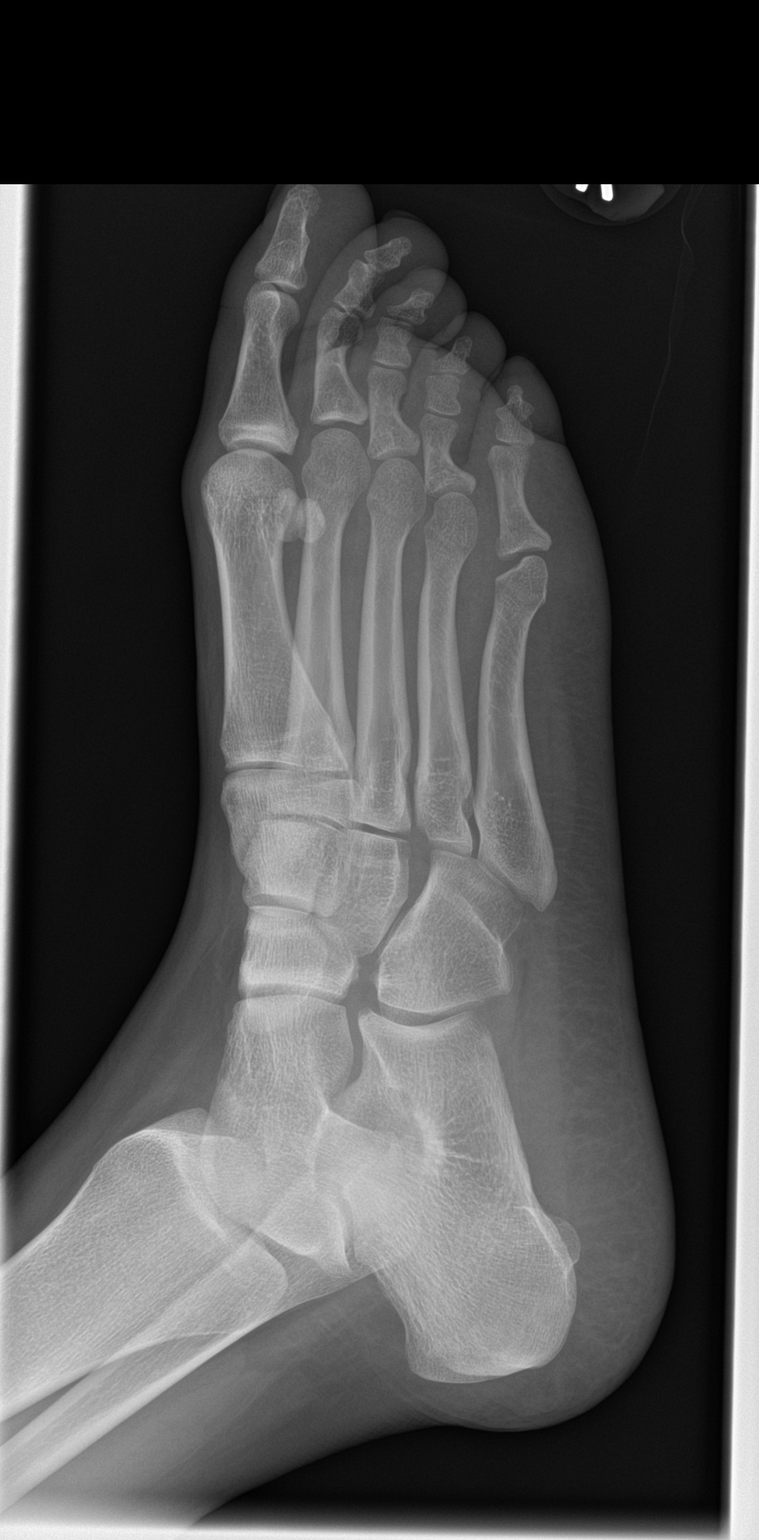
[im 3/3]
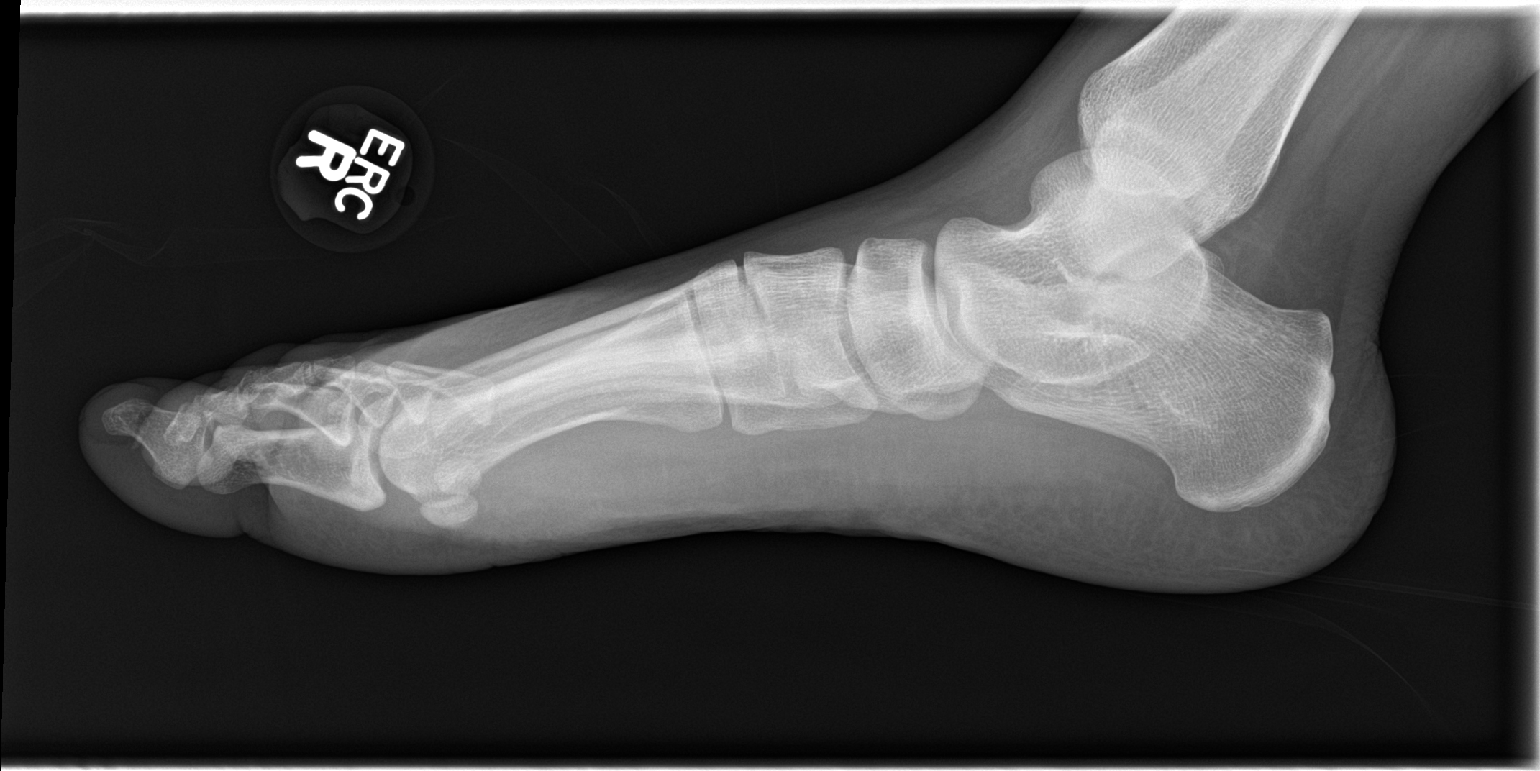

[3 of 3 positions shown; findings below may reference images not displayed]

FINDINGS: Oblique base of fifth proximal phalanx intra-articular fracture in
alignment. No dislocation. No destructive bony lesions. Mild hallux
valgus. Soft tissue planes are nonsuspicious.
IMPRESSION: Nondisplaced base of fifth proximal phalanx intra-articular fracture
without dislocation.

  By: Mirkov Tremel

## 2016-10-07 ENCOUNTER — Encounter: Payer: Self-pay | Admitting: Obstetrics and Gynecology

## 2016-10-07 ENCOUNTER — Ambulatory Visit (INDEPENDENT_AMBULATORY_CARE_PROVIDER_SITE_OTHER): Payer: BLUE CROSS/BLUE SHIELD | Admitting: Obstetrics and Gynecology

## 2016-10-07 VITALS — BP 101/71 | HR 83 | Ht 65.0 in | Wt 214.3 lb

## 2016-10-07 DIAGNOSIS — N921 Excessive and frequent menstruation with irregular cycle: Secondary | ICD-10-CM

## 2016-10-07 DIAGNOSIS — D259 Leiomyoma of uterus, unspecified: Secondary | ICD-10-CM

## 2016-10-07 DIAGNOSIS — R5383 Other fatigue: Secondary | ICD-10-CM | POA: Diagnosis not present

## 2016-10-07 MED ORDER — IBUPROFEN 800 MG PO TABS: 800.0000 mg | ORAL_TABLET | Freq: Three times a day (TID) | ORAL | 1 refills | Status: DC | PRN

## 2016-10-07 MED ORDER — NORGESTIMATE-ETH ESTRADIOL 0.25-35 MG-MCG PO TABS: 1.0000 | ORAL_TABLET | Freq: Every day | ORAL | 11 refills | Status: DC

## 2016-10-07 NOTE — Progress Notes (Signed)
    GYNECOLOGY PROGRESS NOTE  Subjective:    Patient ID: Elizabeth Bridges, female    DOB: 02-04-1981, 36 y.o.   MRN: [id number]  HPI  Patient is a 36 y.o. [phone] female with a h/o fibroid uterus who presents for complaints of breakthrough bleeding on OCPs.  Patient currently on [place].  States that she has bright pink or red spotting to light bleeding ~ 1 week after her period for the past several months. Denies missing any pills.  States that she has bright pink or red spotting to light bleeding ~ 1 week after her period for the past several months. Denies missing any pills.     The following portions of the patient's history were reviewed and updated as appropriate: allergies, current medications, past family history, past medical history, past social history, past surgical history and problem list.  Review of Systems A comprehensive review of systems was negative except for: Constitutional: positive for fatigue   Objective:   Blood pressure 101/71, pulse 83, height 5\' 5"  (1.651 m), weight 214 lb 4.8 oz (97.2 kg), last menstrual period 09/28/2016. General appearance: alert and no distress Abdomen: soft, non-tender; bowel sounds normal; no masses,  no organomegaly Pelvic: deferred   Assessment:   Breakthrough bleeding on OCPs H/o fibroids Fatigue  Plan:   - Discussion had on management options, including changing OCPs, starting on NSAIDs the week that she notes bleeding/potting. Patient ok with plan.  Will change from [facility] to [place].   - Will order pelvic ultrasound to reassess fibroid uterus to look for potential fibroid growth. Last imaging was in - 2014 (CT scan to assess renal stones, with incidental finding of fibroids).  - Will check CBC and TSH to evaluate potential cause of fatigue.   Will contact patient with results of utlrasound, and arrange for further f/u as needed.    Rubie Maid, MD [facility]

## 2016-10-08 LAB — CBC
HEMATOCRIT: 39.1 % (ref 34.0–46.6)
HEMOGLOBIN: 12.9 g/dL (ref 11.1–15.9)
MCH: 29.1 pg (ref 26.6–33.0)
MCHC: 33 g/dL (ref 31.5–35.7)
MCV: 88 fL (ref 79–97)
Platelets: 253 10*3/uL (ref 150–379)
RBC: 4.43 x10E6/uL (ref 3.77–5.28)
RDW: 13.5 % (ref 12.3–15.4)
WBC: 6.5 10*3/uL (ref 3.4–10.8)

## 2016-10-08 LAB — TSH: TSH: 1.36 u[IU]/mL (ref 0.450–4.500)

## 2016-10-09 ENCOUNTER — Telehealth: Payer: Self-pay

## 2016-10-09 NOTE — Telephone Encounter (Signed)
Called pt informed her of normal labs 

## 2016-10-09 NOTE — Telephone Encounter (Signed)
-----   Message from Rubie Maid, MD sent at 10/09/2016  1:56 PM EST ----- Please inform patient of normal thyroid levels and her blood count shows she's not anemic.

## 2016-10-14 ENCOUNTER — Ambulatory Visit (INDEPENDENT_AMBULATORY_CARE_PROVIDER_SITE_OTHER): Payer: BLUE CROSS/BLUE SHIELD

## 2016-10-14 DIAGNOSIS — N921 Excessive and frequent menstruation with irregular cycle: Secondary | ICD-10-CM | POA: Diagnosis not present

## 2016-10-14 DIAGNOSIS — D259 Leiomyoma of uterus, unspecified: Secondary | ICD-10-CM | POA: Diagnosis not present

## 2016-10-22 ENCOUNTER — Telehealth: Payer: Self-pay

## 2016-10-22 NOTE — Telephone Encounter (Signed)
Called pt no answer. LM for pt informing her of information below.  

## 2016-10-22 NOTE — Telephone Encounter (Signed)
-----   Message from Rubie Maid, MD sent at 10/22/2016  2:02 PM EST ----- Somehow it looks like Dr. Amalia Hailey reviewed this report, and I don't know if she was notified of her ultrasound report.  It looks like she has several small fibroids, largest one is ~ 3 cm.  No other masses or polyps noted.  Uterine size is normal. Can continue with current management plan for now.

## 2016-12-17 DIAGNOSIS — J00 Acute nasopharyngitis [common cold]: Secondary | ICD-10-CM | POA: Diagnosis not present

## 2016-12-17 DIAGNOSIS — J029 Acute pharyngitis, unspecified: Secondary | ICD-10-CM | POA: Diagnosis not present

## 2017-02-03 ENCOUNTER — Ambulatory Visit (INDEPENDENT_AMBULATORY_CARE_PROVIDER_SITE_OTHER): Payer: BLUE CROSS/BLUE SHIELD | Admitting: Obstetrics and Gynecology

## 2017-02-03 ENCOUNTER — Encounter: Payer: Self-pay | Admitting: Obstetrics and Gynecology

## 2017-02-03 VITALS — BP 100/68 | HR 72 | Ht 65.0 in | Wt 204.3 lb

## 2017-02-03 DIAGNOSIS — Z3041 Encounter for surveillance of contraceptive pills: Secondary | ICD-10-CM

## 2017-02-03 DIAGNOSIS — Z8639 Personal history of other endocrine, nutritional and metabolic disease: Secondary | ICD-10-CM | POA: Diagnosis not present

## 2017-02-03 DIAGNOSIS — Z113 Encounter for screening for infections with a predominantly sexual mode of transmission: Secondary | ICD-10-CM | POA: Diagnosis not present

## 2017-02-03 DIAGNOSIS — E669 Obesity, unspecified: Secondary | ICD-10-CM | POA: Diagnosis not present

## 2017-02-03 DIAGNOSIS — Z01419 Encounter for gynecological examination (general) (routine) without abnormal findings: Secondary | ICD-10-CM | POA: Diagnosis not present

## 2017-02-03 NOTE — Progress Notes (Signed)
GYNECOLOGY ANNUAL PHYSICAL EXAM PROGRESS NOTE  Subjective:    Elizabeth Bridges is a 36 y.o. [phone] female who presents for an annual exam.  The patient is sexually active. The patient wears seatbelts: yes. The patient participates in regular exercise: yes. Has the patient ever been transfused or tattooed?: no. The patient reports that there is not domestic violence in her life.    The patient has the complaints today. 1. Vaginal discharge, heaviest after cycles, mostly clear but sometimes yellowish or blood tinged.  Denies odor, but notes occasional mild itching.   Gynecologic History Menarche age: 27 Patient's last menstrual period was 01/15/2017. Period Duration (Days): 5 Period Pattern: Regular Menstrual Flow: Moderate Dysmenorrhea: (!) Moderate Dysmenorrhea Symptoms: Cramping  Contraception: OCP (estrogen/progesterone) History of STI's: Trichomoniasis, 2016 Last Pap: 10/2014. Results were: normal.  Denies h/o abnormal pap smears.   Obstetric History   G4   P2   T2   P0   A2   L2    SAB2   TAB0   Ectopic0   Multiple0   Live Births2     # Outcome Date GA Lbr Len/2nd Weight Sex Delivery Anes PTL Lv  4 Term 2013   6 lb 4 oz (2.835 kg) M Vag-Spont     3 Term 2003   6 lb 12 oz (3.062 kg) F Vag-Spont   LIV  2 SAB           1 SAB               Past Medical History:  Diagnosis Date  . Dysmenorrhea   . History of trichomoniasis   . Uterine fibroid     Past Surgical History:  Procedure Laterality Date  . DILATION AND CURETTAGE OF UTERUS     x 2    Family History  Problem Relation Age of Onset  . Asthma Mother   . Hypertension Father   . Heart disease Father   . Diabetes Father     Social History   Social History  . Marital status: Single    Spouse name: N/A  . Number of children: N/A  . Years of education: N/A   Occupational History  . Not on file.   Social History Main Topics  . Smoking status: Never Smoker  . Smokeless tobacco: Never Used  .  Alcohol use No  . Drug use: No  . Sexual activity: Yes    Birth control/ protection: Pill   Other Topics Concern  . Not on file   Social History Narrative  . No narrative on file    Current Outpatient Prescriptions on File Prior to Visit  Medication Sig Dispense Refill  . ibuprofen (ADVIL,MOTRIN) 800 MG tablet Take 1 tablet (800 mg total) by mouth every 8 (eight) hours as needed. 60 tablet 1  . norgestimate-ethinyl estradiol (ORTHO-CYCLEN,SPRINTEC,PREVIFEM) 0.25-35 MG-MCG tablet Take 1 tablet by mouth daily. 1 Package 11   No current facility-administered medications on file prior to visit.     No Known Allergies    Review of Systems Constitutional: negative for chills, fatigue, fevers and sweats Eyes: negative for irritation, redness and visual disturbance Ears, nose, mouth, throat, and face: negative for hearing loss, nasal congestion, snoring and tinnitus Respiratory: negative for asthma, cough, sputum Cardiovascular: negative for chest pain, dyspnea, exertional chest pressure/discomfort, irregular heart beat, palpitations and syncope Gastrointestinal: negative for abdominal pain, change in bowel habits, nausea and vomiting Genitourinary: Positive for  vaginal discharge. Negative for abnormal menstrual  periods, genital lesions, sexual problems, dysuria and urinary incontinence Integument/breast: negative for breast lump, breast tenderness and nipple discharge Hematologic/lymphatic: negative for bleeding and easy bruising Musculoskeletal:negative for back pain and muscle weakness Neurological: negative for dizziness, headaches, vertigo and weakness Endocrine: negative for diabetic symptoms including polydipsia, polyuria and skin dryness Allergic/Immunologic: negative for hay fever and urticaria        Objective:  Blood pressure 100/68, pulse 72, height 5\' 5"  (1.651 m), weight 204 lb 4.8 oz (92.7 kg), last menstrual period 01/15/2017. Body mass index is 34  kg/m.   General Appearance:    Alert, cooperative, no distress, appears stated age  Head:    Normocephalic, without obvious abnormality, atraumatic  Eyes:    PERRL, conjunctiva/corneas clear, EOM's intact, both eyes  Ears:    Normal external ear canals, both ears  Nose:   Nares normal, septum midline, mucosa normal, no drainage or sinus tenderness  Throat:   Lips, mucosa, and tongue normal; teeth and gums normal  Neck:   Supple, symmetrical, trachea midline, no adenopathy; thyroid: no enlargement/tenderness/nodules; no carotid bruit or JVD  Back:     Symmetric, no curvature, ROM normal, no CVA tenderness  Lungs:     Clear to auscultation bilaterally, respirations unlabored  Chest Wall:    No tenderness or deformity   Heart:    Regular rate and rhythm, S1 and S2 normal, no murmur, rub or gallop  Breast Exam:    No tenderness, masses, or nipple abnormality  Abdomen:     Soft, non-tender, bowel sounds active all four quadrants, no masses, no organomegaly.    Genitalia:    Pelvic:external genitalia normal, vagina without lesions, discharge, or tenderness, rectovaginal septum  normal. Cervix normal in appearance, no cervical motion tenderness, no adnexal masses or tenderness.  Uterus normal size, shape, mobile, regular contours, nontender.  Rectal:    Normal external sphincter.  No hemorrhoids appreciated. Internal exam not done.   Extremities:   Extremities normal, atraumatic, no cyanosis or edema  Pulses:   2+ and symmetric all extremities  Skin:   Skin color, texture, turgor normal, no rashes or lesions  Lymph nodes:   Cervical, supraclavicular, and axillary nodes normal  Neurologic:   CNII-XII intact, normal strength, sensation and reflexes throughout   .  Labs:  Lab Results  Component Value Date   WBC 6.5 10/07/2016   HGB 12.9 10/07/2016   HCT 39.1 10/07/2016   MCV 88 10/07/2016   PLT 253 10/07/2016    Lab Results  Component Value Date   CREATININE 0.74 02/01/2016   BUN 10  02/01/2016   NA 141 02/01/2016   K 3.9 02/01/2016   CL 100 02/01/2016   CO2 24 02/01/2016    Lab Results  Component Value Date   ALT 19 02/01/2016   AST 22 02/01/2016   ALKPHOS 78 02/01/2016   BILITOT 0.3 02/01/2016    Lab Results  Component Value Date   TSH 1.360 10/07/2016    Lipid Panel     Component Value Date/Time   CHOL 183 02/01/2016 1425   TRIG 85 02/01/2016 1425   HDL 59 02/01/2016 1425   CHOLHDL 3.1 02/01/2016 1425   [place] 107 (H) 02/01/2016 1425     Assessment:    Well woman exam with routine gynecological exam   History of vitamin D deficiency   Encounter for surveillance of contraceptive pills  Obesity (BMI 30.0-34.9)   Plan:     Blood tests: Comprehensive metabolic panel and Vitamin  D level. Breast self exam technique reviewed and patient encouraged to perform self-exam monthly. Contraception: OCP (estrogen/progesterone). Discussed healthy lifestyle modifications. Pap smear up to date.  Will perform in 2019.  Nuswab performed for vaginal discharge.  Follow up in 1 year.     Rubie Maid, MD [facility]

## 2017-02-03 NOTE — Patient Instructions (Addendum)

## 2017-02-03 NOTE — Addendum Note (Signed)
Addended by: Donalee Citrin on: 02/03/2017 01:45 PM   Modules accepted: Orders

## 2017-02-04 LAB — COMPREHENSIVE METABOLIC PANEL
A/G RATIO: 1.2 (ref 1.2–2.2)
ALBUMIN: 4 g/dL (ref 3.5–5.5)
ALT: 10 IU/L (ref 0–32)
AST: 18 IU/L (ref 0–40)
Alkaline Phosphatase: 54 IU/L (ref 39–117)
BILIRUBIN TOTAL: 0.3 mg/dL (ref 0.0–1.2)
BUN / CREAT RATIO: 8 — AB (ref 9–23)
BUN: 7 mg/dL (ref 6–20)
CHLORIDE: 102 mmol/L (ref 96–106)
CO2: 22 mmol/L (ref 20–29)
Calcium: 9.8 mg/dL (ref 8.7–10.2)
Creatinine, Ser: 0.87 mg/dL (ref 0.57–1.00)
GFR calc non Af Amer: 86 mL/min/{1.73_m2} (ref >59)
GFR, EST AFRICAN AMERICAN: 99 mL/min/{1.73_m2} (ref >59)
GLUCOSE: 81 mg/dL (ref 65–99)
Globulin, Total: 3.3 g/dL (ref 1.5–4.5)
POTASSIUM: 4.2 mmol/L (ref 3.5–5.2)
SODIUM: 139 mmol/L (ref 134–144)
Total Protein: 7.3 g/dL (ref 6.0–8.5)

## 2017-02-04 LAB — VITAMIN D 25 HYDROXY (VIT D DEFICIENCY, FRACTURES): Vit D, 25-Hydroxy: 15.6 ng/mL — ABNORMAL LOW (ref 30.0–100.0)

## 2017-02-05 ENCOUNTER — Telehealth: Payer: Self-pay

## 2017-02-05 DIAGNOSIS — Z8639 Personal history of other endocrine, nutritional and metabolic disease: Secondary | ICD-10-CM

## 2017-02-05 DIAGNOSIS — E559 Vitamin D deficiency, unspecified: Secondary | ICD-10-CM

## 2017-02-05 MED ORDER — VITAMIN D (ERGOCALCIFEROL) 1.25 MG (50000 UNIT) PO CAPS: 50000.0000 [IU] | ORAL_CAPSULE | ORAL | 1 refills | Status: DC

## 2017-02-05 NOTE — Telephone Encounter (Signed)
-----   Message from Rubie Maid, MD sent at 02/04/2017 11:34 PM EDT ----- Please inform patient of abnormal Vit D levels (deficiency).  Will need to begin 50,000 mIU weekly x12 weeks, then return for repeat lab.

## 2017-02-05 NOTE — Telephone Encounter (Signed)
Called pt no answer. LM for pt informing her of vitamin d deficiency. RX sent in. Future order placed for lab. Appt scheduled for 12wks.

## 2017-02-06 ENCOUNTER — Telehealth: Payer: Self-pay

## 2017-02-06 DIAGNOSIS — N760 Acute vaginitis: Principal | ICD-10-CM

## 2017-02-06 DIAGNOSIS — B9689 Other specified bacterial agents as the cause of diseases classified elsewhere: Secondary | ICD-10-CM

## 2017-02-06 LAB — NUSWAB VAGINITIS PLUS (VG+)
Atopobium vaginae: HIGH Score — AB
BVAB 2: HIGH Score — AB
CHLAMYDIA TRACHOMATIS, NAA: NEGATIVE
Candida albicans, NAA: NEGATIVE
Candida glabrata, NAA: NEGATIVE
Neisseria gonorrhoeae, NAA: NEGATIVE
Trich vag by NAA: NEGATIVE

## 2017-02-06 MED ORDER — METRONIDAZOLE 500 MG PO TABS: 500.0000 mg | ORAL_TABLET | Freq: Two times a day (BID) | ORAL | 0 refills | Status: DC

## 2017-02-06 NOTE — Telephone Encounter (Signed)
-----   Message from Rubie Maid, MD sent at 02/06/2017  2:26 PM EDT ----- Please inform patient of positive BV infection. Can treat with Metrogel or Flagyl.

## 2017-02-06 NOTE — Telephone Encounter (Signed)
Called pt, no answer. LM for pt informing her of BV. RX sent in.

## 2017-05-07 ENCOUNTER — Other Ambulatory Visit: Payer: BLUE CROSS/BLUE SHIELD

## 2017-05-07 DIAGNOSIS — Z8639 Personal history of other endocrine, nutritional and metabolic disease: Secondary | ICD-10-CM

## 2017-05-07 DIAGNOSIS — E559 Vitamin D deficiency, unspecified: Secondary | ICD-10-CM

## 2017-05-08 ENCOUNTER — Other Ambulatory Visit: Payer: BLUE CROSS/BLUE SHIELD

## 2017-05-08 LAB — VITAMIN D 25 HYDROXY (VIT D DEFICIENCY, FRACTURES): Vit D, 25-Hydroxy: 30.3 ng/mL (ref 30.0–100.0)

## 2017-05-13 ENCOUNTER — Telehealth: Payer: Self-pay

## 2017-05-13 NOTE — Telephone Encounter (Signed)
-----   Message from Rubie Maid, MD sent at 05/11/2017 11:59 AM EDT ----- Please inform patient that her Vit D levels are normal.  She should be taking a multivitamin containing at least 800 mIU of Vit D to maintain her levels.

## 2017-05-13 NOTE — Telephone Encounter (Signed)
Called pt no answer. LM for pt informing her of normal vit d and the need to take multivitamin.

## 2017-08-07 ENCOUNTER — Other Ambulatory Visit: Payer: Self-pay | Admitting: Obstetrics and Gynecology

## 2018-02-09 ENCOUNTER — Ambulatory Visit (INDEPENDENT_AMBULATORY_CARE_PROVIDER_SITE_OTHER): Payer: BLUE CROSS/BLUE SHIELD | Admitting: Obstetrics and Gynecology

## 2018-02-09 ENCOUNTER — Encounter: Payer: Self-pay | Admitting: Obstetrics and Gynecology

## 2018-02-09 VITALS — BP 110/77 | HR 79 | Ht 65.0 in | Wt 217.2 lb

## 2018-02-09 DIAGNOSIS — D259 Leiomyoma of uterus, unspecified: Secondary | ICD-10-CM

## 2018-02-09 DIAGNOSIS — Z01419 Encounter for gynecological examination (general) (routine) without abnormal findings: Secondary | ICD-10-CM | POA: Diagnosis not present

## 2018-02-09 DIAGNOSIS — N921 Excessive and frequent menstruation with irregular cycle: Secondary | ICD-10-CM | POA: Diagnosis not present

## 2018-02-09 DIAGNOSIS — Z8639 Personal history of other endocrine, nutritional and metabolic disease: Secondary | ICD-10-CM | POA: Diagnosis not present

## 2018-02-09 DIAGNOSIS — Z113 Encounter for screening for infections with a predominantly sexual mode of transmission: Secondary | ICD-10-CM | POA: Diagnosis not present

## 2018-02-09 DIAGNOSIS — Z124 Encounter for screening for malignant neoplasm of cervix: Secondary | ICD-10-CM | POA: Diagnosis not present

## 2018-02-09 DIAGNOSIS — Z13220 Encounter for screening for lipoid disorders: Secondary | ICD-10-CM | POA: Diagnosis not present

## 2018-02-09 DIAGNOSIS — E669 Obesity, unspecified: Secondary | ICD-10-CM

## 2018-02-09 MED ORDER — NORGESTIM-ETH ESTRAD TRIPHASIC 0.18/0.215/0.25 MG-35 MCG PO TABS: 1.0000 | ORAL_TABLET | Freq: Every day | ORAL | 11 refills | Status: DC

## 2018-02-09 NOTE — Patient Instructions (Signed)
Preventive Care 18-39 Years, Female Preventive care refers to lifestyle choices and visits with your health care provider that can promote health and wellness. What does preventive care include?  A yearly physical exam. This is also called an annual well check.  Dental exams once or twice a year.  Routine eye exams. Ask your health care provider how often you should have your eyes checked.  Personal lifestyle choices, including: ? Daily care of your teeth and gums. ? Regular physical activity. ? Eating a healthy diet. ? Avoiding tobacco and drug use. ? Limiting alcohol use. ? Practicing safe sex. ? Taking vitamin and mineral supplements as recommended by your health care provider. What happens during an annual well check? The services and screenings done by your health care provider during your annual well check will depend on your age, overall health, lifestyle risk factors, and family history of disease. Counseling Your health care provider may ask you questions about your:  Alcohol use.  Tobacco use.  Drug use.  Emotional well-being.  Home and relationship well-being.  Sexual activity.  Eating habits.  Work and work Statistician.  Method of birth control.  Menstrual cycle.  Pregnancy history.  Screening You may have the following tests or measurements:  Height, weight, and BMI.  Diabetes screening. This is done by checking your blood sugar (glucose) after you have not eaten for a while (fasting).  Blood pressure.  Lipid and cholesterol levels. These may be checked every 5 years starting at age 38.  Skin check.  Hepatitis C blood test.  Hepatitis B blood test.  Sexually transmitted disease (STD) testing.  BRCA-related cancer screening. This may be done if you have a family history of breast, ovarian, tubal, or peritoneal cancers.  Pelvic exam and Pap test. This may be done every 3 years starting at age 38. Starting at age 30, this may be done  every 5 years if you have a Pap test in combination with an HPV test.  Discuss your test results, treatment options, and if necessary, the need for more tests with your health care provider. Vaccines Your health care provider may recommend certain vaccines, such as:  Influenza vaccine. This is recommended every year.  Tetanus, diphtheria, and acellular pertussis (Tdap, Td) vaccine. You may need a Td booster every 10 years.  Varicella vaccine. You may need this if you have not been vaccinated.  HPV vaccine. If you are 39 or younger, you may need three doses over 6 months.  Measles, mumps, and rubella (MMR) vaccine. You may need at least one dose of MMR. You may also need a second dose.  Pneumococcal 13-valent conjugate (PCV13) vaccine. You may need this if you have certain conditions and were not previously vaccinated.  Pneumococcal polysaccharide (PPSV23) vaccine. You may need one or two doses if you smoke cigarettes or if you have certain conditions.  Meningococcal vaccine. One dose is recommended if you are age 68-21 years and a first-year college student living in a residence hall, or if you have one of several medical conditions. You may also need additional booster doses.  Hepatitis A vaccine. You may need this if you have certain conditions or if you travel or work in places where you may be exposed to hepatitis A.  Hepatitis B vaccine. You may need this if you have certain conditions or if you travel or work in places where you may be exposed to hepatitis B.  Haemophilus influenzae type b (Hib) vaccine. You may need this  if you have certain risk factors.  Talk to your health care provider about which screenings and vaccines you need and how often you need them. This information is not intended to replace advice given to you by your health care provider. Make sure you discuss any questions you have with your health care provider. Document Released: 10/07/2001 Document Revised:  04/30/2016 Document Reviewed: 06/12/2015 Elsevier Interactive Patient Education  [employer].

## 2018-02-09 NOTE — Progress Notes (Signed)
Pt is present today for her annual exam. Pt stated that she has some concerns about her birth control pill. Pt stated that she is having some break through bleeding. No other concerns.

## 2018-02-09 NOTE — Progress Notes (Signed)
GYNECOLOGY ANNUAL PHYSICAL EXAM PROGRESS NOTE  Subjective:    Elizabeth Bridges is a 37 y.o. [phone] female who presents for an annual exam.  The patient is sexually active. The patient wears seatbelts: yes. The patient participates in regular exercise: yes. Has the patient ever been transfused or tattooed?: no. The patient reports that there is not domestic violence in her life.    The patient has the complaints today. 1. Breakthrough bleeding on OCPs. This has been ongoing for several months.  Usually begins spotting during week 3 of the pill pack.  Notes that she was having a dark brown discharge and possibly membranes?.  Has been on this pill for 1 year.   Gynecologic History Menarche age: 56 Patient's last menstrual period was 01/14/2018. Contraception: OCP (estrogen/progesterone) History of STI's: Trichomoniasis, 2016 Last Pap: 10/2014. Results were: normal.  Denies h/o abnormal pap smears.   OB History  Gravida Para Term Preterm AB Living  4 2 2  0 2 2  SAB TAB Ectopic Multiple Live Births  2 0 0 0 2    # Outcome Date GA Lbr Len/2nd Weight Sex Delivery Anes PTL Lv  4 Term 2013   6 lb 4 oz (2.835 kg) M Vag-Spont     3 Term 2003   6 lb 12 oz (3.062 kg) F Vag-Spont   LIV  2 SAB           1 SAB             Past Medical History:  Diagnosis Date  . Dysmenorrhea   . History of trichomoniasis   . Uterine fibroid     Past Surgical History:  Procedure Laterality Date  . DILATION AND CURETTAGE OF UTERUS     x 2    Family History  Problem Relation Age of Onset  . Asthma Mother   . Hypertension Father   . Heart disease Father   . Diabetes Father     Social History   Socioeconomic History  . Marital status: Single    Spouse name: Not on file  . Number of children: Not on file  . Years of education: Not on file  . Highest education level: Not on file  Occupational History  . Not on file  Social Needs  . Financial resource strain: Not on file  . Food  insecurity:    Worry: Not on file    Inability: Not on file  . Transportation needs:    Medical: Not on file    Non-medical: Not on file  Tobacco Use  . Smoking status: Never Smoker  . Smokeless tobacco: Never Used  Substance and Sexual Activity  . Alcohol use: No  . Drug use: No  . Sexual activity: Yes    Birth control/protection: Pill  Lifestyle  . Physical activity:    Days per week: Not on file    Minutes per session: Not on file  . Stress: Not on file  Relationships  . Social connections:    Talks on phone: Not on file    Gets together: Not on file    Attends religious service: Not on file    Active member of club or organization: Not on file    Attends meetings of clubs or organizations: Not on file    Relationship status: Not on file  . Intimate partner violence:    Fear of current or ex partner: Not on file    Emotionally abused: Not on file  Physically abused: Not on file    Forced sexual activity: Not on file  Other Topics Concern  . Not on file  Social History Narrative  . Not on file    Current Outpatient Medications on File Prior to Visit  Medication Sig Dispense Refill  . SPRINTEC 28 0.25-35 MG-MCG tablet TAKE 1 TABLET BY MOUTH DAILY. 28 tablet 11  . ibuprofen (ADVIL,MOTRIN) 800 MG tablet Take 1 tablet (800 mg total) by mouth every 8 (eight) hours as needed. (Patient not taking: Reported on 02/09/2018) 60 tablet 1   No current facility-administered medications on file prior to visit.     No Known Allergies    Review of Systems Constitutional: negative for chills, fatigue, fevers and sweats Eyes: negative for irritation, redness and visual disturbance Ears, nose, mouth, throat, and face: negative for hearing loss, nasal congestion, snoring and tinnitus Respiratory: negative for asthma, cough, sputum Cardiovascular: negative for chest pain, dyspnea, exertional chest pressure/discomfort, irregular heart beat, palpitations and  syncope Gastrointestinal: negative for abdominal pain, change in bowel habits, nausea and vomiting Genitourinary: Positive for abnormal menstrual periods (breakthrough bleeding on OCPs). Negative for genital lesions, sexual problems, dysuria and urinary incontinence Integument/breast: negative for breast lump, breast tenderness and nipple discharge Hematologic/lymphatic: negative for bleeding and easy bruising Musculoskeletal:negative for back pain and muscle weakness Neurological: negative for dizziness, headaches, vertigo and weakness Endocrine: negative for diabetic symptoms including polydipsia, polyuria and skin dryness Allergic/Immunologic: negative for hay fever and urticaria        Objective:  Blood pressure 110/77, pulse 79, height 5\' 5"  (1.651 m), weight 217 lb 3.2 oz (98.5 kg), last menstrual period 01/14/2018. Body mass index is 36.14 kg/m.   General Appearance:    Alert, cooperative, no distress, appears stated age, moderate obesity  Head:    Normocephalic, without obvious abnormality, atraumatic  Eyes:    PERRL, conjunctiva/corneas clear, EOM's intact, both eyes  Ears:    Normal external ear canals, both ears  Nose:   Nares normal, septum midline, mucosa normal, no drainage or sinus tenderness  Throat:   Lips, mucosa, and tongue normal; teeth and gums normal  Neck:   Supple, symmetrical, trachea midline, no adenopathy; thyroid: no enlargement/tenderness/nodules; no carotid bruit or JVD  Back:     Symmetric, no curvature, ROM normal, no CVA tenderness  Lungs:     Clear to auscultation bilaterally, respirations unlabored  Chest Wall:    No tenderness or deformity   Heart:    Regular rate and rhythm, S1 and S2 normal, no murmur, rub or gallop  Breast Exam:    No tenderness, masses, or nipple abnormality  Abdomen:     Soft, non-tender, bowel sounds active all four quadrants, no masses, no organomegaly.    Genitalia:    Pelvic:external genitalia normal, vagina without lesions,  discharge, or tenderness, rectovaginal septum  normal. Cervix normal in appearance, no cervical motion tenderness, no adnexal masses or tenderness.  Uterus normal size, shape, mobile, regular contours, nontender.  Rectal:    Normal external sphincter.  No hemorrhoids appreciated. Internal exam not done.   Extremities:   Extremities normal, atraumatic, no cyanosis or edema  Pulses:   2+ and symmetric all extremities  Skin:   Skin color, texture, turgor normal, no rashes or lesions  Lymph nodes:   Cervical, supraclavicular, and axillary nodes normal  Neurologic:   CNII-XII intact, normal strength, sensation and reflexes throughout   .  Labs:  Lab Results  Component Value Date   WBC  6.5 10/07/2016   HGB 12.9 10/07/2016   HCT 39.1 10/07/2016   MCV 88 10/07/2016   PLT 253 10/07/2016    Lab Results  Component Value Date   CREATININE 0.87 02/03/2017   BUN 7 02/03/2017   NA 139 02/03/2017   K 4.2 02/03/2017   CL 102 02/03/2017   CO2 22 02/03/2017    Lab Results  Component Value Date   ALT 10 02/03/2017   AST 18 02/03/2017   ALKPHOS 54 02/03/2017   BILITOT 0.3 02/03/2017    Lab Results  Component Value Date   TSH 1.360 10/07/2016    Lipid Panel     Component Value Date/Time   CHOL 183 02/01/2016 1425   TRIG 85 02/01/2016 1425   HDL 59 02/01/2016 1425   CHOLHDL 3.1 02/01/2016 1425   [place] 107 (H) 02/01/2016 1425     Assessment:   Well woman exam with routine gynecological exam H/O vitamin D deficiency Breakthrough bleeding on birth control pills Obesity (BMI 35.0-39.9 without comorbidity)  Cervical cancer screening  Screen for STDs Fibroid uterus  Plan:    Blood tests: CMP, Comprehensive metabolic panel, and Vitamin D level. Breast self exam technique reviewed and patient encouraged to perform self-exam monthly. Contraception: OCP (estrogen/progesterone).  Will change from Sprintec to Tri-Sprintec. Discussed that if she continues to experience issues with  OCPs, that she may need to consider an alternative method of birth control.  Fibroid uterus stable, no evidence of growth or change on today's exam. Not a likely cause of breakthrough bleeding.  Discussed healthy lifestyle modifications. Pap smear performed today. Continue routine screening.  Offered STD screening during today's exam, patient ok to be tested.  Performed today Follow up in 1 year.     Rubie Maid, MD [facility]

## 2018-02-10 LAB — COMPREHENSIVE METABOLIC PANEL
ALBUMIN: 3.9 g/dL (ref 3.5–5.5)
ALK PHOS: 57 IU/L (ref 39–117)
ALT: 12 IU/L (ref 0–32)
AST: 13 IU/L (ref 0–40)
Albumin/Globulin Ratio: 1.3 (ref 1.2–2.2)
BUN/Creatinine Ratio: 12 (ref 9–23)
BUN: 9 mg/dL (ref 6–20)
Bilirubin Total: 0.2 mg/dL (ref 0.0–1.2)
CO2: 22 mmol/L (ref 20–29)
CREATININE: 0.73 mg/dL (ref 0.57–1.00)
Calcium: 9.5 mg/dL (ref 8.7–10.2)
Chloride: 106 mmol/L (ref 96–106)
GFR calc Af Amer: 122 mL/min/{1.73_m2} (ref >59)
GFR calc non Af Amer: 106 mL/min/{1.73_m2} (ref >59)
Globulin, Total: 3.1 g/dL (ref 1.5–4.5)
Glucose: 88 mg/dL (ref 65–99)
Potassium: 4.3 mmol/L (ref 3.5–5.2)
Sodium: 136 mmol/L (ref 134–144)
Total Protein: 7 g/dL (ref 6.0–8.5)

## 2018-02-10 LAB — CBC
HEMOGLOBIN: 12.5 g/dL (ref 11.1–15.9)
Hematocrit: 37.3 % (ref 34.0–46.6)
MCH: 29.6 pg (ref 26.6–33.0)
MCHC: 33.5 g/dL (ref 31.5–35.7)
MCV: 88 fL (ref 79–97)
Platelets: 235 10*3/uL (ref 150–450)
RBC: 4.22 x10E6/uL (ref 3.77–5.28)
RDW: 13.7 % (ref 12.3–15.4)
WBC: 4.7 10*3/uL (ref 3.4–10.8)

## 2018-02-10 LAB — LIPID PANEL
Chol/HDL Ratio: 2.4 ratio (ref 0.0–4.4)
Cholesterol, Total: 179 mg/dL (ref 100–199)
HDL: 76 mg/dL (ref >39)
LDL Calculated: 88 mg/dL (ref 0–99)
TRIGLYCERIDES: 77 mg/dL (ref 0–149)
VLDL Cholesterol Cal: 15 mg/dL (ref 5–40)

## 2018-02-10 LAB — TSH: TSH: 1.81 u[IU]/mL (ref 0.450–4.500)

## 2018-02-10 LAB — HIV ANTIBODY (ROUTINE TESTING W REFLEX): HIV SCREEN 4TH GENERATION: NONREACTIVE

## 2018-02-10 LAB — VITAMIN D 25 HYDROXY (VIT D DEFICIENCY, FRACTURES): VIT D 25 HYDROXY: 18.6 ng/mL — AB (ref 30.0–100.0)

## 2018-02-11 LAB — IGP, COBASHPV16/18
HPV 16: NEGATIVE
HPV 18: NEGATIVE
HPV OTHER HR TYPES: NEGATIVE
PAP SMEAR COMMENT: 0

## 2018-02-11 LAB — GC/CHLAMYDIA PROBE AMP
Chlamydia trachomatis, NAA: NEGATIVE
Neisseria gonorrhoeae by PCR: NEGATIVE

## 2018-05-13 ENCOUNTER — Other Ambulatory Visit: Payer: BLUE CROSS/BLUE SHIELD

## 2018-05-13 DIAGNOSIS — Z8639 Personal history of other endocrine, nutritional and metabolic disease: Secondary | ICD-10-CM

## 2018-05-13 NOTE — Addendum Note (Signed)
Addended by: Lorinda Creed on: 05/13/2018 08:14 AM   Modules accepted: Orders

## 2018-05-14 LAB — VITAMIN D 25 HYDROXY (VIT D DEFICIENCY, FRACTURES): Vit D, 25-Hydroxy: 34.2 ng/mL (ref 30.0–100.0)

## 2018-10-29 ENCOUNTER — Encounter: Payer: Self-pay | Admitting: Emergency Medicine

## 2018-10-29 ENCOUNTER — Other Ambulatory Visit: Payer: Self-pay

## 2018-10-29 ENCOUNTER — Emergency Department
Admission: EM | Admit: 2018-10-29 | Discharge: 2018-10-29 | Disposition: A | Discharge status: Home / Self Care | Payer: BLUE CROSS/BLUE SHIELD | Attending: Emergency Medicine | Admitting: Emergency Medicine

## 2018-10-29 ENCOUNTER — Emergency Department: Payer: BLUE CROSS/BLUE SHIELD

## 2018-10-29 DIAGNOSIS — R079 Chest pain, unspecified: Secondary | ICD-10-CM | POA: Diagnosis not present

## 2018-10-29 LAB — BASIC METABOLIC PANEL
Anion gap: 9 (ref 5–15)
BUN: 15 mg/dL (ref 6–20)
CO2: 26 mmol/L (ref 22–32)
Calcium: 9.4 mg/dL (ref 8.9–10.3)
Chloride: 104 mmol/L (ref 98–111)
Creatinine, Ser: 0.85 mg/dL (ref 0.44–1.00)
GFR calc non Af Amer: >60 mL/min (ref >60)
Glucose, Bld: 90 mg/dL (ref 70–99)
Potassium: 3.8 mmol/L (ref 3.5–5.1)
Sodium: 139 mmol/L (ref 135–145)

## 2018-10-29 LAB — URINALYSIS, COMPLETE (UACMP) WITH MICROSCOPIC
Bilirubin Urine: NEGATIVE
Glucose, UA: NEGATIVE mg/dL
KETONES UR: NEGATIVE mg/dL
Nitrite: NEGATIVE
Protein, ur: NEGATIVE mg/dL
Specific Gravity, Urine: 1.024 (ref 1.005–1.030)
pH: 6 (ref 5.0–8.0)

## 2018-10-29 LAB — PREGNANCY, URINE: Preg Test, Ur: NEGATIVE

## 2018-10-29 LAB — CBC
HCT: 39.1 % (ref 36.0–46.0)
HEMOGLOBIN: 12.6 g/dL (ref 12.0–15.0)
MCH: 29.3 pg (ref 26.0–34.0)
MCHC: 32.2 g/dL (ref 30.0–36.0)
MCV: 90.9 fL (ref 80.0–100.0)
NRBC: 0 % (ref 0.0–0.2)
Platelets: 255 10*3/uL (ref 150–400)
RBC: 4.3 MIL/uL (ref 3.87–5.11)
RDW: 12.8 % (ref 11.5–15.5)
WBC: 8.3 10*3/uL (ref 4.0–10.5)

## 2018-10-29 LAB — TROPONIN I: Troponin I: <0.03 ng/mL (ref <0.03)

## 2018-10-29 MED ORDER — DIAZEPAM 5 MG PO TABS: 5.0000 mg | ORAL_TABLET | Freq: Three times a day (TID) | ORAL | 0 refills | Status: DC | PRN

## 2018-10-29 MED ORDER — IBUPROFEN 600 MG PO TABS: 600.0000 mg | ORAL_TABLET | Freq: Three times a day (TID) | ORAL | 0 refills | Status: DC | PRN

## 2018-10-29 MED ORDER — DIAZEPAM 5 MG PO TABS
5.0000 mg | ORAL_TABLET | Freq: Once | ORAL | Status: AC
  Administered 2018-10-29: 5 mg via ORAL
  Filled 2018-10-29: qty 1

## 2018-10-29 NOTE — ED Provider Notes (Signed)
[facility] Emergency Department Provider Note       Time seen: ----------------------------------------- 9:58 PM on 10/29/2018 -----------------------------------------   I have reviewed the triage vital signs and the nursing notes.  HISTORY   Chief Complaint Chest Pain    HPI MEGANN EASTERWOOD is a 38 y.o. female with a history of dysmenorrhea who presents to the ED for left-sided chest and arm pain.  Patient has had the pain on and off since Monday.  She reports the pain was so bad today she had to leave work.  She is never had this happen before, nothing makes it better or worse.  She denies any recent injury or trauma.  Past Medical History:  Diagnosis Date  . Dysmenorrhea   . History of trichomoniasis   . Uterine fibroid     There are no active problems to display for this patient.   Past Surgical History:  Procedure Laterality Date  . DILATION AND CURETTAGE OF UTERUS     x 2    Allergies Patient has no known allergies.  Social History Social History   Tobacco Use  . Smoking status: Never Smoker  . Smokeless tobacco: Never Used  Substance Use Topics  . Alcohol use: No  . Drug use: No    Review of Systems Constitutional: Negative for fever. Cardiovascular: Positive for chest pain Respiratory: Negative for shortness of breath. Gastrointestinal: Negative for abdominal pain, vomiting and diarrhea. Musculoskeletal: Positive for left arm pain Skin: Negative for rash. Neurological: Negative for headaches, focal weakness or numbness.  All systems negative/normal/unremarkable except as stated in the HPI  ____________________________________________   PHYSICAL EXAM:  VITAL SIGNS: ED Triage Vitals  Enc Vitals Group     BP 10/29/18 2131 (!) 171/105     Pulse Rate 10/29/18 2131 80     Resp 10/29/18 2131 16     Temp 10/29/18 2131 98.4 F (36.9 C)     Temp Source 10/29/18 2131 Oral     SpO2 10/29/18 2131 100 %     Weight  10/29/18 2132 220 lb (99.8 kg)     Height 10/29/18 2132 5\' 5"  (1.651 m)     Head Circumference --      Peak Flow --      Pain Score 10/29/18 2136 6     Pain Loc --      Pain Edu? --      Excl. in [place]? --     Constitutional: Alert and oriented. Well appearing and in no distress. Eyes: Conjunctivae are normal. Normal extraocular movements. ENT      Head: Normocephalic and atraumatic.      Nose: No congestion/rhinnorhea.      Mouth/Throat: Mucous membranes are moist.      Neck: No stridor. Cardiovascular: Normal rate, regular rhythm. No murmurs, rubs, or gallops. Respiratory: Normal respiratory effort without tachypnea nor retractions. Breath sounds are clear and equal bilaterally. No wheezes/rales/rhonchi. Gastrointestinal: Soft and nontender. Normal bowel sounds Musculoskeletal: Nontender with normal range of motion in extremities. No lower extremity tenderness nor edema. Neurologic:  Normal speech and language. No gross focal neurologic deficits are appreciated.  Skin:  Skin is warm, dry and intact. No rash noted. Psychiatric: Mood and affect are normal. Speech and behavior are normal.  ____________________________________________  EKG: Interpreted by me.  Sinus rhythm with a rate of 73 bpm, normal PR interval, normal QRS, normal QT  ____________________________________________  ED COURSE:  As part of my medical decision making, I reviewed the  following data within the [place] History obtained from family if available, nursing notes, old chart and ekg, as well as notes from prior ED visits. Patient presented for left-sided chest and arm pain, we will assess with labs and imaging as indicated at this time.   Procedures ____________________________________________   LABS (pertinent positives/negatives)  Labs Reviewed  URINALYSIS, COMPLETE (UACMP) WITH MICROSCOPIC - Abnormal; Notable for the following components:      Result Value   Color, Urine YELLOW (*)     APPearance CLEAR (*)    Hgb urine dipstick SMALL (*)    Leukocytes,Ua TRACE (*)    Bacteria, UA RARE (*)    All other components within normal limits  BASIC METABOLIC PANEL  CBC  TROPONIN I  PREGNANCY, URINE    RADIOLOGY  Chest x-ray IMPRESSION: No active cardiopulmonary disease. ____________________________________________   DIFFERENTIAL DIAGNOSIS   Musculoskeletal pain, GERD, anxiety, unstable angina or MI unlikely  FINAL ASSESSMENT AND PLAN  Chest pain   Plan: The patient had presented for nonspecific chest pain. Patient's labs were reassuring. Patient's imaging was also reassuring.  Patient has musculoskeletal pain, she will be discharged with anti-inflammatory muscle relaxants.   Laurence Aly, MD    Note: This note was generated in part or whole with voice recognition software. Voice recognition is usually quite accurate but there are transcription errors that can and very often do occur. I apologize for any typographical errors that were not detected and corrected.     Earleen Newport, MD 10/29/18 2212

## 2018-10-29 NOTE — ED Triage Notes (Signed)
Pt arrives POV and ambulatory to triage with chest pain and left arm pain since since Monday. Pt denies other cardiac symptoms and is in NAD.

## 2018-12-29 ENCOUNTER — Other Ambulatory Visit: Payer: Self-pay | Admitting: Obstetrics and Gynecology

## 2019-02-15 ENCOUNTER — Other Ambulatory Visit: Payer: Self-pay

## 2019-02-15 ENCOUNTER — Ambulatory Visit (INDEPENDENT_AMBULATORY_CARE_PROVIDER_SITE_OTHER): Payer: BC Managed Care – PPO | Admitting: Obstetrics and Gynecology

## 2019-02-15 ENCOUNTER — Encounter: Payer: Self-pay | Admitting: Obstetrics and Gynecology

## 2019-02-15 ENCOUNTER — Other Ambulatory Visit (HOSPITAL_COMMUNITY)
Admission: RE | Admit: 2019-02-15 | Discharge: 2019-02-15 | Disposition: A | Discharge status: Home / Self Care | Payer: BC Managed Care – PPO | Source: Ambulatory Visit | Attending: Obstetrics and Gynecology | Admitting: Obstetrics and Gynecology

## 2019-02-15 VITALS — BP 121/84 | HR 76 | Ht 65.0 in | Wt 230.4 lb

## 2019-02-15 DIAGNOSIS — D251 Intramural leiomyoma of uterus: Secondary | ICD-10-CM

## 2019-02-15 DIAGNOSIS — Z8639 Personal history of other endocrine, nutritional and metabolic disease: Secondary | ICD-10-CM | POA: Insufficient documentation

## 2019-02-15 DIAGNOSIS — Z01419 Encounter for gynecological examination (general) (routine) without abnormal findings: Secondary | ICD-10-CM | POA: Insufficient documentation

## 2019-02-15 DIAGNOSIS — N921 Excessive and frequent menstruation with irregular cycle: Secondary | ICD-10-CM | POA: Insufficient documentation

## 2019-02-15 DIAGNOSIS — E669 Obesity, unspecified: Secondary | ICD-10-CM

## 2019-02-15 NOTE — Progress Notes (Signed)
GYNECOLOGY ANNUAL PHYSICAL EXAM PROGRESS NOTE  Subjective:    Elizabeth Bridges is a 38 y.o. [phone] female who presents for an annual exam.  The patient is sexually active. The patient wears seatbelts: yes. The patient participates in regular exercise: yes. Has the patient ever been transfused or tattooed?: no. The patient reports that there is not domestic violence in her life.    The patient has the complaints today. 1. Still notes metrorrhagia, occurring every 2 weeks. First episode of bleeding usually starts 2 weeks into OCPs, usually light, can be red or brown and lasts 5-7 days.   Then 2 weeks later, she will have her normal cycle.   Gynecologic History Menarche age: 74 Patient's last menstrual period was 02/09/2019. Contraception: OCP (estrogen/progesterone) History of STI's: Trichomoniasis, 2016 Last Pap: 02/09/2018. Results were: normal.  Denies h/o abnormal pap smears.   OB History  Gravida Para Term Preterm AB Living  4 2 2  0 2 2  SAB TAB Ectopic Multiple Live Births  2 0 0 0 2    # Outcome Date GA Lbr Len/2nd Weight Sex Delivery Anes PTL Lv  4 Term 2013   6 lb 4 oz (2.835 kg) M Vag-Spont     3 Term 2003   6 lb 12 oz (3.062 kg) F Vag-Spont   LIV  2 SAB           1 SAB             Past Medical History:  Diagnosis Date  . Dysmenorrhea   . History of trichomoniasis   . Uterine fibroid     Past Surgical History:  Procedure Laterality Date  . DILATION AND CURETTAGE OF UTERUS     x 2    Family History  Problem Relation Age of Onset  . Asthma Mother   . Hypertension Father   . Heart disease Father   . Diabetes Father     Social History   Socioeconomic History  . Marital status: Single    Spouse name: Not on file  . Number of children: Not on file  . Years of education: Not on file  . Highest education level: Not on file  Occupational History  . Not on file  Social Needs  . Financial resource strain: Not on file  . Food insecurity    Worry: Not  on file    Inability: Not on file  . Transportation needs    Medical: Not on file    Non-medical: Not on file  Tobacco Use  . Smoking status: Never Smoker  . Smokeless tobacco: Never Used  Substance and Sexual Activity  . Alcohol use: No  . Drug use: No  . Sexual activity: Yes    Birth control/protection: Pill  Lifestyle  . Physical activity    Days per week: Not on file    Minutes per session: Not on file  . Stress: Not on file  Relationships  . Social Herbalist on phone: Not on file    Gets together: Not on file    Attends religious service: Not on file    Active member of club or organization: Not on file    Attends meetings of clubs or organizations: Not on file    Relationship status: Not on file  . Intimate partner violence    Fear of current or ex partner: Not on file    Emotionally abused: Not on file    Physically abused:  Not on file    Forced sexual activity: Not on file  Other Topics Concern  . Not on file  Social History Narrative  . Not on file    Current Outpatient Medications on File Prior to Visit  Medication Sig Dispense Refill  . Misc Natural Products (APPLE CIDER VINEGAR DIET PO) Take by mouth.    . TRI-PREVIFEM 0.18/0.215/0.25 MG-35 MCG tablet TAKE 1 TABLET BY MOUTH EVERY DAY 28 tablet 2   No current facility-administered medications on file prior to visit.     No Known Allergies    Review of Systems Constitutional: negative for chills, fatigue, fevers and sweats Eyes: negative for irritation, redness and visual disturbance Ears, nose, mouth, throat, and face: negative for hearing loss, nasal congestion, snoring and tinnitus Respiratory: negative for asthma, cough, sputum Cardiovascular: negative for chest pain, dyspnea, exertional chest pressure/discomfort, irregular heart beat, palpitations and syncope Gastrointestinal: negative for abdominal pain, change in bowel habits, nausea and vomiting Genitourinary: Positive for abnormal  menstrual periods (breakthrough bleeding on OCPs). Negative for genital lesions, sexual problems, dysuria and urinary incontinence Integument/breast: negative for breast lump, breast tenderness and nipple discharge Hematologic/lymphatic: negative for bleeding and easy bruising Musculoskeletal:negative for back pain and muscle weakness Neurological: negative for dizziness, headaches, vertigo and weakness Endocrine: negative for diabetic symptoms including polydipsia, polyuria and skin dryness Allergic/Immunologic: negative for hay fever and urticaria        Objective:  Blood pressure 121/84, pulse 76, height 5\' 5"  (1.651 m), weight 230 lb 6.4 oz (104.5 kg), last menstrual period 02/09/2019. Body mass index is 38.34 kg/m.   General Appearance:    Alert, cooperative, no distress, appears stated age, moderate obesity  Head:    Normocephalic, without obvious abnormality, atraumatic  Eyes:    PERRL, conjunctiva/corneas clear, EOM's intact, both eyes  Ears:    Normal external ear canals, both ears  Nose:   Nares normal, septum midline, mucosa normal, no drainage or sinus tenderness  Throat:   Lips, mucosa, and tongue normal; teeth and gums normal  Neck:   Supple, symmetrical, trachea midline, no adenopathy; thyroid: no enlargement/tenderness/nodules; no carotid bruit or JVD  Back:     Symmetric, no curvature, ROM normal, no CVA tenderness  Lungs:     Clear to auscultation bilaterally, respirations unlabored  Chest Wall:    No tenderness or deformity   Heart:    Regular rate and rhythm, S1 and S2 normal, no murmur, rub or gallop  Breast Exam:    No tenderness, masses, or nipple abnormality  Abdomen:     Soft, non-tender, bowel sounds active all four quadrants, no masses, no organomegaly.    Genitalia:    Pelvic:external genitalia normal, vagina without lesions, discharge, or tenderness, rectovaginal septum  normal. Cervix normal in appearance, no cervical motion tenderness, no adnexal masses or  tenderness.  Uterus normal size, shape, mobile, regular contours, nontender.  Rectal:    Normal external sphincter.  No hemorrhoids appreciated. Internal exam not done.   Extremities:   Extremities normal, atraumatic, no cyanosis or edema  Pulses:   2+ and symmetric all extremities  Skin:   Skin color, texture, turgor normal, no rashes or lesions  Lymph nodes:   Cervical, supraclavicular, and axillary nodes normal  Neurologic:   CNII-XII intact, normal strength, sensation and reflexes throughout   .  Labs:  Lab Results  Component Value Date   WBC 8.3 10/29/2018   HGB 12.6 10/29/2018   HCT 39.1 10/29/2018   MCV 90.9  10/29/2018   PLT 255 10/29/2018    Lab Results  Component Value Date   CREATININE 0.85 10/29/2018   BUN 15 10/29/2018   NA 139 10/29/2018   K 3.8 10/29/2018   CL 104 10/29/2018   CO2 26 10/29/2018    Lab Results  Component Value Date   ALT 12 02/09/2018   AST 13 02/09/2018   ALKPHOS 57 02/09/2018   BILITOT 0.2 02/09/2018    Lab Results  Component Value Date   TSH 1.810 02/09/2018    Lipid Panel     Component Value Date/Time   CHOL 179 02/09/2018 0904   TRIG 77 02/09/2018 0904   HDL 76 02/09/2018 0904   CHOLHDL 2.4 02/09/2018 0904   LDLCALC 88 02/09/2018 0904    Results for JAILIN, MANOCCHIO (MRN [id number]) as of 02/15/2019 08:27  Ref. Range 05/13/2018 08:20  Vitamin D, 25-Hydroxy Latest Ref Range: 30.0 - 100.0 ng/mL 34.2   Assessment:   Well woman exam with routine gynecological exam H/O vitamin D deficiency Breakthrough bleeding on birth control pills Obesity (BMI 35.0-39.9 without comorbidity)  Fibroid uterus  Plan:    - Blood tests: Hepatic panel and Vitamin D level. - Breast self exam technique reviewed and patient encouraged to perform self-exam monthly. - Contraception: OCP (estrogen/progesterone).  Still with breakthrough bleeding despite change in OCPs last year.  Discussed that she may need to consider an alternative method of  birth control. Recommend Depo Provera, progesterone IUD (however will need to reassess size of fibroids).  Patient desires to think over options. Given sample of Loestrin to take in addition to her current OCP to help bleeding until ultrasound results return. Has just started new pill pack of current OCPs.  - Fibroid uterus, last imaging in 2018 with several small fibroids. Will f/u ultrasound to reassess size as patient's bleeding has not responded to a change in OCPs.   - Discussed healthy lifestyle modifications. - Pap smear up to date. Next due in 2022.  - Follow up in 1 year, or sooner if indicated by ultrasound results.     Rubie Maid, MD [facility]

## 2019-02-15 NOTE — Patient Instructions (Addendum)
Preventive Care 18-39 Years, Female °Preventive care refers to lifestyle choices and visits with your health care provider that can promote health and wellness. °What does preventive care include? ° °· A yearly physical exam. This is also called an annual well check. °· Dental exams once or twice a year. °· Routine eye exams. Ask your health care provider how often you should have your eyes checked. °· Personal lifestyle choices, including: °? Daily care of your teeth and gums. °? Regular physical activity. °? Eating a healthy diet. °? Avoiding tobacco and drug use. °? Limiting alcohol use. °? Practicing safe sex. °? Taking vitamin and mineral supplements as recommended by your health care provider. °What happens during an annual well check? °The services and screenings done by your health care provider during your annual well check will depend on your age, overall health, lifestyle risk factors, and family history of disease. °Counseling °Your health care provider may ask you questions about your: °· Alcohol use. °· Tobacco use. °· Drug use. °· Emotional well-being. °· Home and relationship well-being. °· Sexual activity. °· Eating habits. °· Work and work environment. °· Method of birth control. °· Menstrual cycle. °· Pregnancy history. °Screening °You may have the following tests or measurements: °· Height, weight, and BMI. °· Diabetes screening. This is done by checking your blood sugar (glucose) after you have not eaten for a while (fasting). °· Blood pressure. °· Lipid and cholesterol levels. These may be checked every 5 years starting at age 20. °· Skin check. °· Hepatitis C blood test. °· Hepatitis B blood test. °· Sexually transmitted disease (STD) testing. °· BRCA-related cancer screening. This may be done if you have a family history of breast, ovarian, tubal, or peritoneal cancers. °· Pelvic exam and Pap test. This may be done every 3 years starting at age 21. Starting at age 30, this may be done every 5  years if you have a Pap test in combination with an HPV test. °Discuss your test results, treatment options, and if necessary, the need for more tests with your health care provider. °Vaccines °Your health care provider may recommend certain vaccines, such as: °· Influenza vaccine. This is recommended every year. °· Tetanus, diphtheria, and acellular pertussis (Tdap, Td) vaccine. You may need a Td booster every 10 years. °· Varicella vaccine. You may need this if you have not been vaccinated. °· HPV vaccine. If you are 26 or younger, you may need three doses over 6 months. °· Measles, mumps, and rubella (MMR) vaccine. You may need at least one dose of MMR. You may also need a second dose. °· Pneumococcal 13-valent conjugate (PCV13) vaccine. You may need this if you have certain conditions and were not previously vaccinated. °· Pneumococcal polysaccharide (PPSV23) vaccine. You may need one or two doses if you smoke cigarettes or if you have certain conditions. °· Meningococcal vaccine. One dose is recommended if you are age 19-21 years and a first-year college student living in a residence hall, or if you have one of several medical conditions. You may also need additional booster doses. °· Hepatitis A vaccine. You may need this if you have certain conditions or if you travel or work in places where you may be exposed to hepatitis A. °· Hepatitis B vaccine. You may need this if you have certain conditions or if you travel or work in places where you may be exposed to hepatitis B. °· Haemophilus influenzae type b (Hib) vaccine. You may need this if you   have certain risk factors. Talk to your health care provider about which screenings and vaccines you need and how often you need them. This information is not intended to replace advice given to you by your health care provider. Make sure you discuss any questions you have with your health care provider. Document Released: 10/07/2001 Document Revised: 03/24/2017  Document Reviewed: 06/12/2015 Elsevier Interactive Patient Education  2019 [place] Breast self-awareness means:  Knowing how your breasts look.  Knowing how your breasts feel.  Checking your breasts every month for changes.  Telling your doctor if you notice a change in your breasts. Breast self-awareness allows you to notice a breast problem early while it is still small. How to do a breast self-exam One way to learn what is normal for your breasts and to check for changes is to do a breast self-exam. To do a breast self-exam: Look for Changes  1. Take off all the clothes above your waist. 2. Stand in front of a mirror in a room with good lighting. 3. Put your hands on your hips. 4. Push your hands down. 5. Look at your breasts and nipples in the mirror to see if one breast or nipple looks different than the other. Check to see if: ? The shape of one breast is different. ? The size of one breast is different. ? There are wrinkles, dips, and bumps in one breast and not the other. 6. Look at each breast for changes in your skin, such as: ? Redness. ? Scaly areas. 7. Look for changes in your nipples, such as: ? Liquid around the nipples. ? Bleeding. ? Dimpling. ? Redness. ? A change in where the nipples are. Feel for Changes 1. Lie on your back on the floor. 2. Feel each breast. To do this, follow these steps: ? Pick a breast to feel. ? Put the arm closest to that breast above your head. ? Use your other arm to feel the nipple area of your breast. Feel the area with the pads of your three middle fingers by making small circles with your fingers. For the first circle, press lightly. For the second circle, press harder. For the third circle, press even harder. ? Keep making circles with your fingers at the light, harder, and even harder pressures as you move down your breast. Stop when you feel your ribs. ? Move your fingers a little toward the center  of your body. ? Start making circles with your fingers again, this time going up until you reach your collarbone. ? Keep making up and down circles until you reach your armpit. Remember to keep using the three pressures. ? Feel the other breast in the same way. 3. Sit or stand in the shower or tub. 4. With soapy water on your skin, feel each breast the same way you did in step 2, when you were lying on the floor. Write Down What You Find After doing the self-exam, write down:  What is normal for each breast.  Any changes you find in each breast.  When you last had your period.  How often should I check my breasts? Check your breasts every month. If you are breastfeeding, the best time to check them is after you feed your baby or after you use a breast pump. If you get periods, the best time to check your breasts is 5-7 days after your period is over. When should I see my doctor? See your doctor if you notice:  A change in shape or size of your breasts or nipples. °· A change in the skin of your breast or nipples, such as red or scaly skin. °· Unusual fluid coming from your nipples. °· A lump or thick area that was not there before. °· Pain in your breasts. °· Anything that concerns you. °This information is not intended to replace advice given to you by your health care provider. Make sure you discuss any questions you have with your health care provider. °Document Released: 01/28/2008 Document Revised: 01/17/2016 Document Reviewed: 07/01/2015 °Elsevier Interactive Patient Education © 2019 Elsevier Inc. ° °

## 2019-02-15 NOTE — Addendum Note (Signed)
Addended by: Edwyna Shell on: 02/15/2019 02:03 PM   Modules accepted: Orders

## 2019-02-16 LAB — HEPATIC FUNCTION PANEL
ALT: 14 IU/L (ref 0–32)
AST: 11 IU/L (ref 0–40)
Albumin: 4 g/dL (ref 3.8–4.8)
Alkaline Phosphatase: 69 IU/L (ref 39–117)
Bilirubin Total: 0.2 mg/dL (ref 0.0–1.2)
Bilirubin, Direct: 0.07 mg/dL (ref 0.00–0.40)
Total Protein: 7 g/dL (ref 6.0–8.5)

## 2019-02-16 LAB — VITAMIN D 25 HYDROXY (VIT D DEFICIENCY, FRACTURES): Vit D, 25-Hydroxy: 27.9 ng/mL — ABNORMAL LOW (ref 30.0–100.0)

## 2019-02-18 LAB — CYTOLOGY - PAP
Diagnosis: NEGATIVE
HPV: NOT DETECTED

## 2019-02-22 ENCOUNTER — Telehealth: Payer: Self-pay

## 2019-02-22 NOTE — Telephone Encounter (Signed)
Coronavirus (COVID-19) Are you at risk?  Are you at risk for the Coronavirus (COVID-19)?  To be considered HIGH RISK for Coronavirus (COVID-19), you have to meet the following criteria:  . Traveled to [place], [place], [place], [place] or [place]; or in the [place] to [place], [place], [place], or [place]; and have fever, cough, and shortness of breath within the last 2 weeks of travel OR . Been in close contact with a person diagnosed with COVID-19 within the last 2 weeks and have fever, cough, and shortness of breath . IF YOU DO NOT MEET THESE CRITERIA, YOU ARE CONSIDERED LOW RISK FOR COVID-19.  What to do if you are HIGH RISK for COVID-19?  . If you are having a medical emergency, call 911. . Seek medical care right away. Before you go to a doctor's office, urgent care or emergency department, call ahead and tell them about your recent travel, contact with someone diagnosed with COVID-19, and your symptoms. You should receive instructions from your physician's office regarding next steps of care.  . When you arrive at healthcare provider, tell the healthcare staff immediately you have returned from visiting [place], [place], [place], [place] or [place]; or traveled in the [place] to [place], [place], [place], or [place]; in the last two weeks or you have been in close contact with a person diagnosed with COVID-19 in the last 2 weeks.   . Tell the health care staff about your symptoms: fever, cough and shortness of breath. . After you have been seen by a medical provider, you will be either: o Tested for (COVID-19) and discharged home on quarantine except to seek medical care if symptoms worsen, and asked to  - Stay home and avoid contact with others until you get your results (4-5 days)  - Avoid travel on public transportation if possible (such as bus, train, or airplane) or o Sent to the Emergency Department by EMS for evaluation, COVID-19 testing, and possible  admission depending on your condition and test results.  What to do if you are LOW RISK for COVID-19?  Reduce your risk of any infection by using the same precautions used for avoiding the common cold or flu:  . Wash your hands often with soap and warm water for at least 20 seconds.  If soap and water are not readily available, use an alcohol-based hand sanitizer with at least 60% alcohol.  . If coughing or sneezing, cover your mouth and nose by coughing or sneezing into the elbow areas of your shirt or coat, into a tissue or into your sleeve (not your hands). . Avoid shaking hands with others and consider head nods or verbal greetings only. . Avoid touching your eyes, nose, or mouth with unwashed hands.  . Avoid close contact with people who are sick. . Avoid places or events with large numbers of people in one location, like concerts or sporting events. . Carefully consider travel plans you have or are making. . If you are planning any travel outside or inside the [place], visit the CDC's Travelers' Health webpage for the latest health notices. . If you have some symptoms but not all symptoms, continue to monitor at home and seek medical attention if your symptoms worsen. . If you are having a medical emergency, call 911.   ADDITIONAL HEALTHCARE OPTIONS FOR PATIENTS  [facility] / e-Visit: [email or web address]         [facility]: [phone]  [facility]: [phone]                   [facility]: [phone]   Prescreened. Neg .cm 

## 2019-02-23 ENCOUNTER — Ambulatory Visit (INDEPENDENT_AMBULATORY_CARE_PROVIDER_SITE_OTHER): Payer: BC Managed Care – PPO

## 2019-02-23 ENCOUNTER — Other Ambulatory Visit: Payer: Self-pay | Admitting: Obstetrics and Gynecology

## 2019-02-23 ENCOUNTER — Other Ambulatory Visit: Payer: Self-pay

## 2019-02-23 DIAGNOSIS — N921 Excessive and frequent menstruation with irregular cycle: Secondary | ICD-10-CM

## 2019-02-23 DIAGNOSIS — D251 Intramural leiomyoma of uterus: Secondary | ICD-10-CM | POA: Diagnosis not present

## 2019-03-21 ENCOUNTER — Other Ambulatory Visit: Payer: Self-pay | Admitting: Obstetrics and Gynecology

## 2020-01-03 DIAGNOSIS — L700 Acne vulgaris: Secondary | ICD-10-CM | POA: Diagnosis not present

## 2020-02-16 ENCOUNTER — Encounter: Payer: BC Managed Care – PPO | Admitting: Obstetrics and Gynecology

## 2020-02-22 ENCOUNTER — Ambulatory Visit (INDEPENDENT_AMBULATORY_CARE_PROVIDER_SITE_OTHER): Payer: BC Managed Care – PPO | Admitting: Obstetrics and Gynecology

## 2020-02-22 ENCOUNTER — Other Ambulatory Visit: Payer: Self-pay

## 2020-02-22 ENCOUNTER — Encounter: Payer: Self-pay | Admitting: Obstetrics and Gynecology

## 2020-02-22 VITALS — BP 122/83 | HR 82 | Ht 65.0 in | Wt 233.5 lb

## 2020-02-22 DIAGNOSIS — Z8639 Personal history of other endocrine, nutritional and metabolic disease: Secondary | ICD-10-CM

## 2020-02-22 DIAGNOSIS — N921 Excessive and frequent menstruation with irregular cycle: Secondary | ICD-10-CM | POA: Diagnosis not present

## 2020-02-22 DIAGNOSIS — D251 Intramural leiomyoma of uterus: Secondary | ICD-10-CM | POA: Diagnosis not present

## 2020-02-22 DIAGNOSIS — Z23 Encounter for immunization: Secondary | ICD-10-CM | POA: Diagnosis not present

## 2020-02-22 DIAGNOSIS — Z01419 Encounter for gynecological examination (general) (routine) without abnormal findings: Secondary | ICD-10-CM | POA: Diagnosis not present

## 2020-02-22 DIAGNOSIS — E669 Obesity, unspecified: Secondary | ICD-10-CM

## 2020-02-22 MED ORDER — MEDROXYPROGESTERONE ACETATE 150 MG/ML IM SUSP: 150.0000 mg | INTRAMUSCULAR | 3 refills | Status: DC

## 2020-02-22 MED ORDER — MEDROXYPROGESTERONE ACETATE 150 MG/ML IM SUSP
150.0000 mg | Freq: Once | INTRAMUSCULAR | Status: AC
  Administered 2020-02-22: 150 mg via INTRAMUSCULAR

## 2020-02-22 MED ORDER — TETANUS-DIPHTH-ACELL PERTUSSIS 5-2.5-18.5 LF-MCG/0.5 IM SUSP
0.5000 mL | Freq: Once | INTRAMUSCULAR | Status: AC
  Administered 2020-02-22: 0.5 mL via INTRAMUSCULAR

## 2020-02-22 NOTE — Progress Notes (Signed)
Pt present for annual exam. Pt stated noticing changes in her cycle since she has been on birth control. Pt stated last cycle was prolonged and having a lot blood clots. Covid vaccine completed in April 2021. Tdap and depo injection administered today.

## 2020-02-22 NOTE — Progress Notes (Signed)
GYNECOLOGY ANNUAL PHYSICAL EXAM PROGRESS NOTE  Subjective:    Elizabeth Bridges is a 39 y.o. [phone] female with h/o fibroid uterus who presents for an annual exam.  The patient is sexually active. The patient wears seatbelts: yes. The patient participates in regular exercise: yes. Has the patient ever been transfused or tattooed?: no. The patient reports that there is not domestic violence in her life.    The patient has the complaints today. 1. Continues to have abnormal bleeding despite use of OCPs.  Notes that her last cycle she was passing very large clots. Also still having spotting in between cycles.   Gynecologic History Menarche age: 37 Patient's last menstrual period was 02/08/2020. Contraception: OCP (estrogen/progesterone) History of STI's: Trichomoniasis, 2016 Last Pap: 02/09/2018. Results were: normal.  Denies h/o abnormal pap smears.   OB History  Gravida Para Term Preterm AB Living  4 2 2  0 2 2  SAB TAB Ectopic Multiple Live Births  2 0 0 0 2    # Outcome Date GA Lbr Len/2nd Weight Sex Delivery Anes PTL Lv  4 Term 2013   6 lb 4 oz (2.835 kg) M Vag-Spont     3 Term 2003   6 lb 12 oz (3.062 kg) F Vag-Spont   LIV  2 SAB           1 SAB             Past Medical History:  Diagnosis Date  . Dysmenorrhea   . History of trichomoniasis   . Uterine fibroid     Past Surgical History:  Procedure Laterality Date  . DILATION AND CURETTAGE OF UTERUS     x 2    Family History  Problem Relation Age of Onset  . Asthma Mother   . Hypertension Father   . Heart disease Father   . Diabetes Father     Social History   Socioeconomic History  . Marital status: Single    Spouse name: Not on file  . Number of children: Not on file  . Years of education: Not on file  . Highest education level: Not on file  Occupational History  . Not on file  Tobacco Use  . Smoking status: Never Smoker  . Smokeless tobacco: Never Used  Vaping Use  . Vaping Use: Never used    Substance and Sexual Activity  . Alcohol use: No  . Drug use: No  . Sexual activity: Yes    Birth control/protection: Pill  Other Topics Concern  . Not on file  Social History Narrative  . Not on file   Social Determinants of Health   Financial Resource Strain:   . Difficulty of Paying Living Expenses:   Food Insecurity:   . Worried About Charity fundraiser in the Last Year:   . Arboriculturist in the Last Year:   Transportation Needs:   . Film/video editor (Medical):   Marland Kitchen Lack of Transportation (Non-Medical):   Physical Activity:   . Days of Exercise per Week:   . Minutes of Exercise per Session:   Stress:   . Feeling of Stress :   Social Connections:   . Frequency of Communication with Friends and Family:   . Frequency of Social Gatherings with Friends and Family:   . Attends Religious Services:   . Active Member of Clubs or Organizations:   . Attends Archivist Meetings:   Marland Kitchen Marital Status:   Intimate  Partner Violence:   . Fear of Current or Ex-Partner:   . Emotionally Abused:   Marland Kitchen Physically Abused:   . Sexually Abused:     Current Outpatient Medications on File Prior to Visit  Medication Sig Dispense Refill  . Misc Natural Products (APPLE CIDER VINEGAR DIET PO) Take by mouth.    . TRI-PREVIFEM 0.18/0.215/0.25 MG-35 MCG tablet TAKE 1 TABLET BY MOUTH EVERY DAY 84 tablet 3   No current facility-administered medications on file prior to visit.    No Known Allergies    Review of Systems Constitutional: negative for chills, fatigue, fevers and sweats Eyes: negative for irritation, redness and visual disturbance Ears, nose, mouth, throat, and face: negative for hearing loss, nasal congestion, snoring and tinnitus Respiratory: negative for asthma, cough, sputum Cardiovascular: negative for chest pain, dyspnea, exertional chest pressure/discomfort, irregular heart beat, palpitations and syncope Gastrointestinal: negative for abdominal pain, change  in bowel habits, nausea and vomiting Genitourinary: Positive for abnormal menstrual periods (breakthrough bleeding on OCPs, heavy cycles). Negative for genital lesions, sexual problems, dysuria and urinary incontinence Integument/breast: negative for breast lump, breast tenderness and nipple discharge Hematologic/lymphatic: negative for bleeding and easy bruising Musculoskeletal:negative for back pain and muscle weakness Neurological: negative for dizziness, headaches, vertigo and weakness Endocrine: negative for diabetic symptoms including polydipsia, polyuria and skin dryness Allergic/Immunologic: negative for hay fever and urticaria        Objective:  Blood pressure 122/83, pulse 82, height 5\' 5"  (1.651 m), weight 233 lb 8 oz (105.9 kg), last menstrual period 02/08/2020. Body mass index is 38.86 kg/m.  General Appearance:    Alert, cooperative, no distress, appears stated age, moderate obesity  Head:    Normocephalic, without obvious abnormality, atraumatic  Eyes:    PERRL, conjunctiva/corneas clear, EOM's intact, both eyes  Ears:    Normal external ear canals, both ears  Nose:   Nares normal, septum midline, mucosa normal, no drainage or sinus tenderness  Throat:   Lips, mucosa, and tongue normal; teeth and gums normal  Neck:   Supple, symmetrical, trachea midline, no adenopathy; thyroid: no enlargement/tenderness/nodules; no carotid bruit or JVD  Back:     Symmetric, no curvature, ROM normal, no CVA tenderness  Lungs:     Clear to auscultation bilaterally, respirations unlabored  Chest Wall:    No tenderness or deformity   Heart:    Regular rate and rhythm, S1 and S2 normal, no murmur, rub or gallop  Breast Exam:    No tenderness, masses, or nipple abnormality  Abdomen:     Soft, non-tender, bowel sounds active all four quadrants, no masses, no organomegaly.    Genitalia:    Pelvic:external genitalia normal, vagina without lesions, discharge, or tenderness, rectovaginal septum   normal. Cervix normal in appearance, no cervical motion tenderness, no adnexal masses or tenderness.  Uterus normal size, shape, mobile, regular contours, nontender.  Rectal:    Normal external sphincter.  No hemorrhoids appreciated. Internal exam not done.   Extremities:   Extremities normal, atraumatic, no cyanosis or edema  Pulses:   2+ and symmetric all extremities  Skin:   Skin color, texture, turgor normal, no rashes or lesions  Lymph nodes:   Cervical, supraclavicular, and axillary nodes normal  Neurologic:   CNII-XII intact, normal strength, sensation and reflexes throughout   .  Labs:   Lab Results  Component Value Date   WBC 8.3 10/29/2018   HGB 12.6 10/29/2018   HCT 39.1 10/29/2018   MCV 90.9 10/29/2018  PLT 255 10/29/2018    Lab Results  Component Value Date   CREATININE 0.85 10/29/2018   BUN 15 10/29/2018   NA 139 10/29/2018   K 3.8 10/29/2018   CL 104 10/29/2018   CO2 26 10/29/2018    Lab Results  Component Value Date   ALT 14 02/15/2019   AST 11 02/15/2019   ALKPHOS 69 02/15/2019   BILITOT 0.2 02/15/2019    Lab Results  Component Value Date   TSH 1.810 02/09/2018    Lipid Panel     Component Value Date/Time   CHOL 179 02/09/2018 0904   TRIG 77 02/09/2018 0904   HDL 76 02/09/2018 0904   CHOLHDL 2.4 02/09/2018 0904   LDLCALC 88 02/09/2018 0904     Assessment:   Well woman exam with routine gynecological exam H/O vitamin D deficiency Breakthrough bleeding on birth control pills Obesity (BMI 35.0-39.9 without comorbidity)  Fibroid uterus  Plan:    - Blood tests: CBC, CMP, Hepatitis C (1 time screening), Vitamin D - Breast self exam technique reviewed and patient encouraged to perform self-exam monthly. - Contraception: OCP (estrogen/progesterone).  Still with breakthrough bleeding despite changing OCPs several times.  Discussed that she may need to consider an alternative method of birth control (recommend Depo Provera, progesterone IUD  (however will need to reassess size of fibroids), or consider surgical intervention. Could also consider use of GnRH agonist/antagonist (Lupron, Oriahn).  Patient willing to now consider Depo Provera.  Will give first injection today. Has been on OCPs, last dose yesterday. Advised on use of back-up contraceptive method for 1 week as she is switching methods of contraception.  - Fibroid uterus, last imaging in 2020 with several small fibroids. Will f/u ultrasound to reassess size as patient's bleeding has not responded to a change in OCPs.   - Discussed healthy lifestyle modifications. - Pap smear up to date. Next due in 2022.  - Tdap injection given as patient is overdue.  - Has completed COVID vaccination series.  - Follow up in 1 year for annual exam. Follow in 3 months for next Depo injection.    Rubie Maid, MD [facility]

## 2020-02-22 NOTE — Patient Instructions (Addendum)
Preventive Care 39-39 Years Old, Female Preventive care refers to visits with your health care provider and lifestyle choices that can promote health and wellness. This includes:  A yearly physical exam. This may also be called an annual well check.  Regular dental visits and eye exams.  Immunizations.  Screening for certain conditions.  Healthy lifestyle choices, such as eating a healthy diet, getting regular exercise, not using drugs or products that contain nicotine and tobacco, and limiting alcohol use. What can I expect for my preventive care visit? Physical exam Your health care provider will check your:  Height and weight. This may be used to calculate body mass index (BMI), which tells if you are at a healthy weight.  Heart rate and blood pressure.  Skin for abnormal spots. Counseling Your health care provider may ask you questions about your:  Alcohol, tobacco, and drug use.  Emotional well-being.  Home and relationship well-being.  Sexual activity.  Eating habits.  Work and work environment.  Method of birth control.  Menstrual cycle.  Pregnancy history. What immunizations do I need?  Influenza (flu) vaccine  This is recommended every year. Tetanus, diphtheria, and pertussis (Tdap) vaccine  You may need a Td booster every 10 years. Varicella (chickenpox) vaccine  You may need this if you have not been vaccinated. Zoster (shingles) vaccine  You may need this after age 60. Measles, mumps, and rubella (MMR) vaccine  You may need at least one dose of MMR if you were born in 1957 or later. You may also need a second dose. Pneumococcal conjugate (PCV13) vaccine  You may need this if you have certain conditions and were not previously vaccinated. Pneumococcal polysaccharide (PPSV23) vaccine  You may need one or two doses if you smoke cigarettes or if you have certain conditions. Meningococcal conjugate (MenACWY) vaccine  You may need this if you  have certain conditions. Hepatitis A vaccine  You may need this if you have certain conditions or if you travel or work in places where you may be exposed to hepatitis A. Hepatitis B vaccine  You may need this if you have certain conditions or if you travel or work in places where you may be exposed to hepatitis B. Haemophilus influenzae type b (Hib) vaccine  You may need this if you have certain conditions. Human papillomavirus (HPV) vaccine  If recommended by your health care provider, you may need three doses over 6 months. You may receive vaccines as individual doses or as more than one vaccine together in one shot (combination vaccines). Talk with your health care provider about the risks and benefits of combination vaccines. What tests do I need? Blood tests  Lipid and cholesterol levels. These may be checked every 5 years, or more frequently if you are over 50 years old.  Hepatitis C test.  Hepatitis B test. Screening  Lung cancer screening. You may have this screening every year starting at age 55 if you have a 30-pack-year history of smoking and currently smoke or have quit within the past 15 years.  Colorectal cancer screening. All adults should have this screening starting at age 50 and continuing until age 75. Your health care provider may recommend screening at age 45 if you are at increased risk. You will have tests every 1-10 years, depending on your results and the type of screening test.  Diabetes screening. This is done by checking your blood sugar (glucose) after you have not eaten for a while (fasting). You may have this   done every 1-3 years.  Mammogram. This may be done every 1-2 years. Talk with your health care provider about when you should start having regular mammograms. This may depend on whether you have a family history of breast cancer.  BRCA-related cancer screening. This may be done if you have a family history of breast, ovarian, tubal, or peritoneal  cancers.  Pelvic exam and Pap test. This may be done every 3 years starting at age 68. Starting at age 40, this may be done every 5 years if you have a Pap test in combination with an HPV test. Other tests  Sexually transmitted disease (STD) testing.  Bone density scan. This is done to screen for osteoporosis. You may have this scan if you are at high risk for osteoporosis. Follow these instructions at home: Eating and drinking  Eat a diet that includes fresh fruits and vegetables, whole grains, lean protein, and low-fat dairy.  Take vitamin and mineral supplements as recommended by your health care provider.  Do not drink alcohol if: ? Your health care provider tells you not to drink. ? You are pregnant, may be pregnant, or are planning to become pregnant.  If you drink alcohol: ? Limit how much you have to 0-1 drink a day. ? Be aware of how much alcohol is in your drink. In the [place] one drink equals one 12 oz bottle of beer (355 mL), one 5 oz glass of wine (148 mL), or one 1 oz glass of hard liquor (44 mL). Lifestyle  Take daily care of your teeth and gums.  Stay active. Exercise for at least 30 minutes on 5 or more days each week.  Do not use any products that contain nicotine or tobacco, such as cigarettes, e-cigarettes, and chewing tobacco. If you need help quitting, ask your health care provider.  If you are sexually active, practice safe sex. Use a condom or other form of birth control (contraception) in order to prevent pregnancy and STIs (sexually transmitted infections).  If told by your health care provider, take low-dose aspirin daily starting at age 55. What's next?  Visit your health care provider once a year for a well check visit.  Ask your health care provider how often you should have your eyes and teeth checked.  Stay up to date on all vaccines. This information is not intended to replace advice given to you by your health care provider. Make sure you  discuss any questions you have with your health care provider. Document Revised: 04/22/2018 Document Reviewed: 04/22/2018 Elsevier Patient Education  2020 [place] 50-3 Years Old, Female Preventive care refers to visits with your health care provider and lifestyle choices that can promote health and wellness. This includes:  A yearly physical exam. This may also be called an annual well check.  Regular dental visits and eye exams.  Immunizations.  Screening for certain conditions.  Healthy lifestyle choices, such as eating a healthy diet, getting regular exercise, not using drugs or products that contain nicotine and tobacco, and limiting alcohol use. What can I expect for my preventive care visit? Physical exam Your health care provider will check your:  Height and weight. This may be used to calculate body mass index (BMI), which tells if you are at a healthy weight.  Heart rate and blood pressure.  Skin for abnormal spots. Counseling Your health care provider may ask you questions about your:  Alcohol, tobacco, and drug use.  Emotional well-being.  Home and relationship  well-being.  Sexual activity.  Eating habits.  Work and work Statistician.  Method of birth control.  Menstrual cycle.  Pregnancy history. What immunizations do I need?  Influenza (flu) vaccine  This is recommended every year. Tetanus, diphtheria, and pertussis (Tdap) vaccine  You may need a Td booster every 10 years. Varicella (chickenpox) vaccine  You may need this if you have not been vaccinated. Human papillomavirus (HPV) vaccine  If recommended by your health care provider, you may need three doses over 6 months. Measles, mumps, and rubella (MMR) vaccine  You may need at least one dose of MMR. You may also need a second dose. Meningococcal conjugate (MenACWY) vaccine  One dose is recommended if you are age 20-21 years and a first-year college student living  in a residence hall, or if you have one of several medical conditions. You may also need additional booster doses. Pneumococcal conjugate (PCV13) vaccine  You may need this if you have certain conditions and were not previously vaccinated. Pneumococcal polysaccharide (PPSV23) vaccine  You may need one or two doses if you smoke cigarettes or if you have certain conditions. Hepatitis A vaccine  You may need this if you have certain conditions or if you travel or work in places where you may be exposed to hepatitis A. Hepatitis B vaccine  You may need this if you have certain conditions or if you travel or work in places where you may be exposed to hepatitis B. Haemophilus influenzae type b (Hib) vaccine  You may need this if you have certain conditions. You may receive vaccines as individual doses or as more than one vaccine together in one shot (combination vaccines). Talk with your health care provider about the risks and benefits of combination vaccines. What tests do I need?  Blood tests  Lipid and cholesterol levels. These may be checked every 5 years starting at age 17.  Hepatitis C test.  Hepatitis B test. Screening  Diabetes screening. This is done by checking your blood sugar (glucose) after you have not eaten for a while (fasting).  Sexually transmitted disease (STD) testing.  BRCA-related cancer screening. This may be done if you have a family history of breast, ovarian, tubal, or peritoneal cancers.  Pelvic exam and Pap test. This may be done every 3 years starting at age 11. Starting at age 31, this may be done every 5 years if you have a Pap test in combination with an HPV test. Talk with your health care provider about your test results, treatment options, and if necessary, the need for more tests. Follow these instructions at home: Eating and drinking   Eat a diet that includes fresh fruits and vegetables, whole grains, lean protein, and low-fat dairy.  Take  vitamin and mineral supplements as recommended by your health care provider.  Do not drink alcohol if: ? Your health care provider tells you not to drink. ? You are pregnant, may be pregnant, or are planning to become pregnant.  If you drink alcohol: ? Limit how much you have to 0-1 drink a day. ? Be aware of how much alcohol is in your drink. In the [place] one drink equals one 12 oz bottle of beer (355 mL), one 5 oz glass of wine (148 mL), or one 1 oz glass of hard liquor (44 mL). Lifestyle  Take daily care of your teeth and gums.  Stay active. Exercise for at least 30 minutes on 5 or more days each week.  Do not use  any products that contain nicotine or tobacco, such as cigarettes, e-cigarettes, and chewing tobacco. If you need help quitting, ask your health care provider.  If you are sexually active, practice safe sex. Use a condom or other form of birth control (contraception) in order to prevent pregnancy and STIs (sexually transmitted infections). If you plan to become pregnant, see your health care provider for a preconception visit. What's next?  Visit your health care provider once a year for a well check visit.  Ask your health care provider how often you should have your eyes and teeth checked.  Stay up to date on all vaccines. This information is not intended to replace advice given to you by your health care provider. Make sure you discuss any questions you have with your health care provider. Document Revised: 04/22/2018 Document Reviewed: 04/22/2018 Elsevier Patient Education  2020 [place] Breast self-awareness is knowing how your breasts look and feel. Doing breast self-awareness is important. It allows you to catch a breast problem early while it is still small and can be treated. All women should do breast self-awareness, including women who have had breast implants. Tell your doctor if you notice a change in your breasts. What you  need:  A mirror.  A well-lit room. How to do a breast self-exam A breast self-exam is one way to learn what is normal for your breasts and to check for changes. To do a breast self-exam: Look for changes  1. Take off all the clothes above your waist. 2. Stand in front of a mirror in a room with good lighting. 3. Put your hands on your hips. 4. Push your hands down. 5. Look at your breasts and nipples in the mirror to see if one breast or nipple looks different from the other. Check to see if: ? The shape of one breast is different. ? The size of one breast is different. ? There are wrinkles, dips, and bumps in one breast and not the other. 6. Look at each breast for changes in the skin, such as: ? Redness. ? Scaly areas. 7. Look for changes in your nipples, such as: ? Liquid around the nipples. ? Bleeding. ? Dimpling. ? Redness. ? A change in where the nipples are. Feel for changes  1. Lie on your back on the floor. 2. Feel each breast. To do this, follow these steps: ? Pick a breast to feel. ? Put the arm closest to that breast above your head. ? Use your other arm to feel the nipple area of your breast. Feel the area with the pads of your three middle fingers by making small circles with your fingers. For the first circle, press lightly. For the second circle, press harder. For the third circle, press even harder. ? Keep making circles with your fingers at the different pressures as you move down your breast. Stop when you feel your ribs. ? Move your fingers a little toward the center of your body. ? Start making circles with your fingers again, this time going up until you reach your collarbone. ? Keep making up-and-down circles until you reach your armpit. Remember to keep using the three pressures. ? Feel the other breast in the same way. 3. Sit or stand in the tub or shower. 4. With soapy water on your skin, feel each breast the same way you did in step 2 when you were  lying on the floor. Write down what you find Writing down what you  find can help you remember what to tell your doctor. Write down:  What is normal for each breast.  Any changes you find in each breast, including: ? The kind of changes you find. ? Whether you have pain. ? Size and location of any lumps.  When you last had your menstrual period. General tips  Check your breasts every month.  If you are breastfeeding, the best time to check your breasts is after you feed your baby or after you use a breast pump.  If you get menstrual periods, the best time to check your breasts is 5-7 days after your menstrual period is over.  With time, you will become comfortable with the self-exam, and you will begin to know if there are changes in your breasts. Contact a doctor if you:  See a change in the shape or size of your breasts or nipples.  See a change in the skin of your breast or nipples, such as red or scaly skin.  Have fluid coming from your nipples that is not normal.  Find a lump or thick area that was not there before.  Have pain in your breasts.  Have any concerns about your breast health. Summary  Breast self-awareness includes looking for changes in your breasts, as well as feeling for changes within your breasts.  Breast self-awareness should be done in front of a mirror in a well-lit room.  You should check your breasts every month. If you get menstrual periods, the best time to check your breasts is 5-7 days after your menstrual period is over.  Let your doctor know of any changes you see in your breasts, including changes in size, changes on the skin, pain or tenderness, or fluid from your nipples that is not normal. This information is not intended to replace advice given to you by your health care provider. Make sure you discuss any questions you have with your health care provider. Document Revised: 03/30/2018 Document Reviewed: 03/30/2018 Elsevier Patient  Education  2020 [employer].   Medroxyprogesterone injection [Contraceptive] What is this medicine? MEDROXYPROGESTERONE (me DROX ee proe JES te rone) contraceptive injections prevent pregnancy. They provide effective birth control for 3 months. Depo-subQ Provera 104 is also used for treating pain related to endometriosis. This medicine may be used for other purposes; ask your health care provider or pharmacist if you have questions. COMMON BRAND NAME(S): Depo-Provera, Depo-subQ Provera 104 What should I tell my health care provider before I take this medicine? They need to know if you have any of these conditions:  frequently drink alcohol  asthma  blood vessel disease or a history of a blood clot in the lungs or legs  bone disease such as osteoporosis  breast cancer  diabetes  eating disorder (anorexia nervosa or bulimia)  high blood pressure  HIV infection or AIDS  kidney disease  liver disease  mental depression  migraine  seizures (convulsions)  stroke  tobacco smoker  vaginal bleeding  an unusual or allergic reaction to medroxyprogesterone, other hormones, medicines, foods, dyes, or preservatives  pregnant or trying to get pregnant  breast-feeding How should I use this medicine? Depo-Provera Contraceptive injection is given into a muscle. Depo-subQ Provera 104 injection is given under the skin. These injections are given by a health care professional. You must not be pregnant before getting an injection. The injection is usually given during the first 5 days after the start of a menstrual period or 6 weeks after delivery of a baby. Talk  to your pediatrician regarding the use of this medicine in children. Special care may be needed. These injections have been used in female children who have started having menstrual periods. Overdosage: If you think you have taken too much of this medicine contact a poison control center or emergency room at once. NOTE:  This medicine is only for you. Do not share this medicine with others. What if I miss a dose? Try not to miss a dose. You must get an injection once every 3 months to maintain birth control. If you cannot keep an appointment, call and reschedule it. If you wait longer than 13 weeks between Depo-Provera contraceptive injections or longer than 14 weeks between Depo-subQ Provera 104 injections, you could get pregnant. Use another method for birth control if you miss your appointment. You may also need a pregnancy test before receiving another injection. What may interact with this medicine? Do not take this medicine with any of the following medications:  bosentan This medicine may also interact with the following medications:  aminoglutethimide  antibiotics or medicines for infections, especially rifampin, rifabutin, rifapentine, and griseofulvin  aprepitant  barbiturate medicines such as phenobarbital or primidone  bexarotene  carbamazepine  medicines for seizures like ethotoin, felbamate, oxcarbazepine, phenytoin, topiramate  modafinil  St. John's wort This list may not describe all possible interactions. Give your health care provider a list of all the medicines, herbs, non-prescription drugs, or dietary supplements you use. Also tell them if you smoke, drink alcohol, or use illegal drugs. Some items may interact with your medicine. What should I watch for while using this medicine? This drug does not protect you against HIV infection (AIDS) or other sexually transmitted diseases. Use of this product may cause you to lose calcium from your bones. Loss of calcium may cause weak bones (osteoporosis). Only use this product for more than 2 years if other forms of birth control are not right for you. The longer you use this product for birth control the more likely you will be at risk for weak bones. Ask your health care professional how you can keep strong bones. You may have a change in  bleeding pattern or irregular periods. Many females stop having periods while taking this drug. If you have received your injections on time, your chance of being pregnant is very low. If you think you may be pregnant, see your health care professional as soon as possible. Tell your health care professional if you want to get pregnant within the next year. The effect of this medicine may last a long time after you get your last injection. What side effects may I notice from receiving this medicine? Side effects that you should report to your doctor or health care professional as soon as possible:  allergic reactions like skin rash, itching or hives, swelling of the face, lips, or tongue  breast tenderness or discharge  breathing problems  changes in vision  depression  feeling faint or lightheaded, falls  fever  pain in the abdomen, chest, groin, or leg  problems with balance, talking, walking  unusually weak or tired  yellowing of the eyes or skin Side effects that usually do not require medical attention (report to your doctor or health care professional if they continue or are bothersome):  acne  fluid retention and swelling  headache  irregular periods, spotting, or absent periods  temporary pain, itching, or skin reaction at site where injected  weight gain This list may not describe all possible side  effects. Call your doctor for medical advice about side effects. You may report side effects to FDA at 1-800-FDA-1088. Where should I keep my medicine? This does not apply. The injection will be given to you by a health care professional. NOTE: This sheet is a summary. It may not cover all possible information. If you have questions about this medicine, talk to your doctor, pharmacist, or health care provider.  2020 Elsevier/Gold Standard (2008-09-01 18:37:56)

## 2020-02-26 ENCOUNTER — Other Ambulatory Visit: Payer: Self-pay | Admitting: Obstetrics and Gynecology

## 2020-03-07 ENCOUNTER — Other Ambulatory Visit: Payer: BC Managed Care – PPO

## 2020-03-13 ENCOUNTER — Ambulatory Visit (INDEPENDENT_AMBULATORY_CARE_PROVIDER_SITE_OTHER): Payer: BC Managed Care – PPO

## 2020-03-13 ENCOUNTER — Other Ambulatory Visit: Payer: Self-pay

## 2020-03-13 DIAGNOSIS — D251 Intramural leiomyoma of uterus: Secondary | ICD-10-CM | POA: Diagnosis not present

## 2020-04-06 DIAGNOSIS — L700 Acne vulgaris: Secondary | ICD-10-CM | POA: Diagnosis not present

## 2020-05-04 NOTE — Progress Notes (Signed)
Last depo inj: 02/22/20 UPT: N/A Side effects: none Next Depo- Provera injection due: 07/26/20-08/09/20 Annual exam due: 2022

## 2020-05-10 ENCOUNTER — Other Ambulatory Visit: Payer: Self-pay

## 2020-05-10 ENCOUNTER — Ambulatory Visit (INDEPENDENT_AMBULATORY_CARE_PROVIDER_SITE_OTHER): Payer: BC Managed Care – PPO

## 2020-05-10 DIAGNOSIS — Z3042 Encounter for surveillance of injectable contraceptive: Secondary | ICD-10-CM | POA: Diagnosis not present

## 2020-05-10 MED ORDER — MEDROXYPROGESTERONE ACETATE 150 MG/ML IM SUSP
150.0000 mg | Freq: Once | INTRAMUSCULAR | Status: AC
  Administered 2020-05-10: 150 mg via INTRAMUSCULAR

## 2020-06-13 DIAGNOSIS — Z20822 Contact with and (suspected) exposure to covid-19: Secondary | ICD-10-CM | POA: Diagnosis not present

## 2020-06-17 DIAGNOSIS — Z20822 Contact with and (suspected) exposure to covid-19: Secondary | ICD-10-CM | POA: Diagnosis not present

## 2020-08-02 ENCOUNTER — Other Ambulatory Visit: Payer: Self-pay

## 2020-08-02 ENCOUNTER — Ambulatory Visit (INDEPENDENT_AMBULATORY_CARE_PROVIDER_SITE_OTHER): Payer: BC Managed Care – PPO

## 2020-08-02 DIAGNOSIS — Z3042 Encounter for surveillance of injectable contraceptive: Secondary | ICD-10-CM

## 2020-08-02 MED ORDER — MEDROXYPROGESTERONE ACETATE 150 MG/ML IM SUSP
150.0000 mg | Freq: Once | INTRAMUSCULAR | Status: AC
  Administered 2020-08-02: 150 mg via INTRAMUSCULAR

## 2020-08-02 NOTE — Progress Notes (Signed)
Last depo inj: 05/10/20 UPT:N/A Side effects: none Next Depo- Provera injection due: 10/18/20-11/01/20 Annual exam due: 02/21/21 Dr Marcelline Mates Appointment scheduled for 10/18/20 at 8 AM

## 2020-10-18 ENCOUNTER — Ambulatory Visit (INDEPENDENT_AMBULATORY_CARE_PROVIDER_SITE_OTHER): Payer: BC Managed Care – PPO | Admitting: Obstetrics and Gynecology

## 2020-10-18 ENCOUNTER — Other Ambulatory Visit: Payer: Self-pay

## 2020-10-18 DIAGNOSIS — Z3042 Encounter for surveillance of injectable contraceptive: Secondary | ICD-10-CM

## 2020-10-18 MED ORDER — MEDROXYPROGESTERONE ACETATE 150 MG/ML IM SUSP
150.0000 mg | Freq: Once | INTRAMUSCULAR | Status: AC
  Administered 2020-10-18: 150 mg via INTRAMUSCULAR

## 2020-10-18 NOTE — Patient Instructions (Signed)
Medroxyprogesterone injection [Contraceptive] What is this medicine? MEDROXYPROGESTERONE (me DROX ee proe JES te rone) contraceptive injections prevent pregnancy. They provide effective birth control for 3 months. Depo-SubQ Provera 104 injection is also used for treating pain related to endometriosis. This medicine may be used for other purposes; ask your health care provider or pharmacist if you have questions. COMMON BRAND NAME(S): Depo-Provera, Depo-subQ Provera 104 What should I tell my health care provider before I take this medicine? They need to know if you have any of these conditions:  asthma  blood clots  breast cancer or family history of breast cancer  depression  diabetes  eating disorder (anorexia nervosa)  heart attack  high blood pressure  HIV infection or AIDS  if you often drink alcohol  kidney disease  liver disease  migraine headaches  osteoporosis, weak bones  seizures  stroke  tobacco smoker  vaginal bleeding  an unusual or allergic reaction to medroxyprogesterone, other hormones, medicines, foods, dyes, or preservatives  pregnant or trying to get pregnant  breast-feeding How should I use this medicine? Depo-Provera CI contraceptive injection is given into a muscle. Depo-subQ Provera 104 injection is given under the skin. It is given by a health care provider in a hospital or clinic setting. The injection is usually given during the first 5 days after the start of a menstrual period or 6 weeks after delivery of a baby. A patient package insert for the product will be given with each prescription and refill. Be sure to read this information carefully each time. The sheet may change often. Talk to your pediatrician regarding the use of this medicine in children. Special care may be needed. These injections have been used in female children who have started having menstrual periods. Overdosage: If you think you have taken too much of this  medicine contact a poison control center or emergency room at once. NOTE: This medicine is only for you. Do not share this medicine with others. What if I miss a dose? Keep appointments for follow-up doses. You must get an injection once every 3 months. It is important not to miss your dose. Call your health care provider if you are unable to keep an appointment. What may interact with this medicine?  antibiotics or medicines for infections, especially rifampin and griseofulvin  antivirals for HIV or hepatitis  aprepitant  armodafinil  bexarotene  bosentan  medicines for seizures like carbamazepine, felbamate, oxcarbazepine, phenytoin, phenobarbital, primidone, topiramate  mitotane  modafinil  St. John's wort This list may not describe all possible interactions. Give your health care provider a list of all the medicines, herbs, non-prescription drugs, or dietary supplements you use. Also tell them if you smoke, drink alcohol, or use illegal drugs. Some items may interact with your medicine. What should I watch for while using this medicine? This drug does not protect you against HIV infection (AIDS) or other sexually transmitted diseases. Use of this product may cause you to lose calcium from your bones. Loss of calcium may cause weak bones (osteoporosis). Only use this product for more than 2 years if other forms of birth control are not right for you. The longer you use this product for birth control the more likely you will be at risk for weak bones. Ask your health care professional how you can keep strong bones. You may have a change in bleeding pattern or irregular periods. Many females stop having periods while taking this drug. If you have received your injections on time, your   chance of being pregnant is very low. If you think you may be pregnant, see your health care professional as soon as possible. Tell your health care professional if you want to get pregnant within the  next year. The effect of this medicine may last a long time after you get your last injection. What side effects may I notice from receiving this medicine? Side effects that you should report to your doctor or health care professional as soon as possible:  allergic reactions like skin rash, itching or hives, swelling of the face, lips, or tongue  blood clot (chest pain; shortness of breath; pain, swelling, or warmth in the leg)  breast tenderness or discharge  changes in emotions or moods  changes in vision  liver injury (dark yellow or brown urine; general ill feeling or flu-like symptoms; loss of appetite, right upper belly pain; unusually weak or tired, yellowing of the eyes or skin)  persistent pain, pus, or bleeding at the injection site  stroke (changes in vision; confusion; trouble speaking or understanding; severe headaches; sudden numbness or weakness of the face, arm or leg; trouble walking; dizziness; loss of balance or coordination)  trouble breathing Side effects that usually do not require medical attention (report to your doctor or health care professional if they continue or are bothersome):  change in sex drive  dizziness  fluid retention  headache  irregular periods, spotting, or absent periods  pain, redness, or irritation at site where injected  stomach pain  weight gain This list may not describe all possible side effects. Call your doctor for medical advice about side effects. You may report side effects to FDA at 1-800-FDA-1088. Where should I keep my medicine? This injection is only given by a health care provider. It will not be stored at home. NOTE: This sheet is a summary. It may not cover all possible information. If you have questions about this medicine, talk to your doctor, pharmacist, or health care provider.  2021 Elsevier/Gold Standard (2019-09-28 10:29:21)  

## 2020-10-18 NOTE — Progress Notes (Signed)
Date last pap: 02/15/19 Last Depo-Provera: 08/02/2020 Side Effects if any: c/o back pain - relieved with motrin.  Serum HCG indicated? N/a. Depo-Provera 150 mg IM given by: Melburn Hake Next appointment due 5/12-5/26.

## 2020-10-20 ENCOUNTER — Encounter: Payer: Self-pay | Admitting: Obstetrics and Gynecology

## 2020-11-14 DIAGNOSIS — J029 Acute pharyngitis, unspecified: Secondary | ICD-10-CM | POA: Diagnosis not present

## 2020-11-29 DIAGNOSIS — K219 Gastro-esophageal reflux disease without esophagitis: Secondary | ICD-10-CM | POA: Diagnosis not present

## 2020-11-29 DIAGNOSIS — F458 Other somatoform disorders: Secondary | ICD-10-CM | POA: Diagnosis not present

## 2021-01-10 ENCOUNTER — Ambulatory Visit: Payer: BC Managed Care – PPO

## 2021-01-11 ENCOUNTER — Other Ambulatory Visit: Payer: Self-pay

## 2021-01-11 ENCOUNTER — Ambulatory Visit (INDEPENDENT_AMBULATORY_CARE_PROVIDER_SITE_OTHER): Payer: BC Managed Care – PPO | Admitting: Obstetrics and Gynecology

## 2021-01-11 DIAGNOSIS — Z3042 Encounter for surveillance of injectable contraceptive: Secondary | ICD-10-CM | POA: Diagnosis not present

## 2021-01-11 MED ORDER — MEDROXYPROGESTERONE ACETATE 150 MG/ML IM SUSP
150.0000 mg | INTRAMUSCULAR | Status: AC
  Administered 2021-01-11 – 2022-02-28 (×5): 150 mg via INTRAMUSCULAR

## 2021-01-11 NOTE — Progress Notes (Signed)
Date last pap: 02/15/2019 Last Depo-Provera: 10/18/2020. Side Effects if any: None Serum HCG indicated? N/A Depo-Provera 150 mg IM given by: Cristy Folks, CMA Next appointment due Aug 5-Aug 19

## 2021-01-21 ENCOUNTER — Encounter: Payer: Self-pay | Admitting: Obstetrics and Gynecology

## 2021-01-21 DIAGNOSIS — Z20822 Contact with and (suspected) exposure to covid-19: Secondary | ICD-10-CM | POA: Diagnosis not present

## 2021-02-25 ENCOUNTER — Other Ambulatory Visit: Payer: Self-pay | Admitting: Obstetrics and Gynecology

## 2021-03-21 ENCOUNTER — Ambulatory Visit (INDEPENDENT_AMBULATORY_CARE_PROVIDER_SITE_OTHER): Payer: BC Managed Care – PPO | Admitting: Obstetrics and Gynecology

## 2021-03-21 ENCOUNTER — Encounter: Payer: BC Managed Care – PPO | Admitting: Obstetrics and Gynecology

## 2021-03-21 ENCOUNTER — Other Ambulatory Visit: Payer: Self-pay

## 2021-03-21 ENCOUNTER — Encounter: Payer: Self-pay | Admitting: Obstetrics and Gynecology

## 2021-03-21 VITALS — BP 120/86 | HR 88 | Ht 65.0 in | Wt 224.0 lb

## 2021-03-21 DIAGNOSIS — Z131 Encounter for screening for diabetes mellitus: Secondary | ICD-10-CM

## 2021-03-21 DIAGNOSIS — Z1231 Encounter for screening mammogram for malignant neoplasm of breast: Secondary | ICD-10-CM | POA: Diagnosis not present

## 2021-03-21 DIAGNOSIS — Z3042 Encounter for surveillance of injectable contraceptive: Secondary | ICD-10-CM

## 2021-03-21 DIAGNOSIS — Z01419 Encounter for gynecological examination (general) (routine) without abnormal findings: Secondary | ICD-10-CM

## 2021-03-21 DIAGNOSIS — Z8639 Personal history of other endocrine, nutritional and metabolic disease: Secondary | ICD-10-CM

## 2021-03-21 DIAGNOSIS — E669 Obesity, unspecified: Secondary | ICD-10-CM

## 2021-03-21 DIAGNOSIS — D251 Intramural leiomyoma of uterus: Secondary | ICD-10-CM

## 2021-03-21 MED ORDER — MEDROXYPROGESTERONE ACETATE 150 MG/ML IM SUSP: 150.0000 mg | INTRAMUSCULAR | 3 refills | Status: DC

## 2021-03-21 NOTE — Patient Instructions (Signed)
Breast Self-Awareness Breast self-awareness is knowing how your breasts look and feel. Doing breast self-awareness is important. It allows you to catch a breast problem early while it is still small and can be treated. All women should do breast self-awareness, including women who have had breast implants. Tell your doctorif you notice a change in your breasts. What you need: A mirror. A well-lit room. How to do a breast self-exam A breast self-exam is one way to learn what is normal for your breasts and tocheck for changes. To do a breast self-exam: Look for changes  Take off all the clothes above your waist. Stand in front of a mirror in a room with good lighting. Put your hands on your hips. Push your hands down. Look at your breasts and nipples in the mirror to see if one breast or nipple looks different from the other. Check to see if: The shape of one breast is different. The size of one breast is different. There are wrinkles, dips, and bumps in one breast and not the other. Look at each breast for changes in the skin, such as: Redness. Scaly areas. Look for changes in your nipples, such as: Liquid around the nipples. Bleeding. Dimpling. Redness. A change in where the nipples are.  Feel for changes  Lie on your back on the floor. Feel each breast. To do this, follow these steps: Pick a breast to feel. Put the arm closest to that breast above your head. Use your other arm to feel the nipple area of your breast. Feel the area with the pads of your three middle fingers by making small circles with your fingers. For the first circle, press lightly. For the second circle, press harder. For the third circle, press even harder. Keep making circles with your fingers at the different pressures as you move down your breast. Stop when you feel your ribs. Move your fingers a little toward the center of your body. Start making circles with your fingers again, this time going up until  you reach your collarbone. Keep making up-and-down circles until you reach your armpit. Remember to keep using the three pressures. Feel the other breast in the same way. Sit or stand in the tub or shower. With soapy water on your skin, feel each breast the same way you did in step 2 when you were lying on the floor.  Write down what you find Writing down what you find can help you remember what to tell your doctor. Write down: What is normal for each breast. Any changes you find in each breast, including: The kind of changes you find. Whether you have pain. Size and location of any lumps. When you last had your menstrual period. General tips Check your breasts every month. If you are breastfeeding, the best time to check your breasts is after you feed your baby or after you use a breast pump. If you get menstrual periods, the best time to check your breasts is 5-7 days after your menstrual period is over. With time, you will become comfortable with the self-exam, and you will begin to know if there are changes in your breasts. Contact a doctor if you: See a change in the shape or size of your breasts or nipples. See a change in the skin of your breast or nipples, such as red or scaly skin. Have fluid coming from your nipples that is not normal. Find a lump or thick area that was not there before. Have pain in   your breasts. Have any concerns about your breast health. Summary Breast self-awareness includes looking for changes in your breasts, as well as feeling for changes within your breasts. Breast self-awareness should be done in front of a mirror in a well-lit room. You should check your breasts every month. If you get menstrual periods, the best time to check your breasts is 5-7 days after your menstrual period is over. Let your doctor know of any changes you see in your breasts, including changes in size, changes on the skin, pain or tenderness, or fluid from your nipples that is  not normal. This information is not intended to replace advice given to you by your health care provider. Make sure you discuss any questions you have with your healthcare provider. Document Revised: 03/30/2018 Document Reviewed: 03/30/2018 Elsevier Patient Education  2022 Elsevier Inc.     Preventive Care 40-64 Years Old, Female Preventive care refers to lifestyle choices and visits with your health care provider that can promote health and wellness. This includes: A yearly physical exam. This is also called an annual wellness visit. Regular dental and eye exams. Immunizations. Screening for certain conditions. Healthy lifestyle choices, such as: Eating a healthy diet. Getting regular exercise. Not using drugs or products that contain nicotine and tobacco. Limiting alcohol use. What can I expect for my preventive care visit? Physical exam Your health care provider will check your: Height and weight. These may be used to calculate your BMI (body mass index). BMI is a measurement that tells if you are at a healthy weight. Heart rate and blood pressure. Body temperature. Skin for abnormal spots. Counseling Your health care provider may ask you questions about your: Past medical problems. Family's medical history. Alcohol, tobacco, and drug use. Emotional well-being. Home life and relationship well-being. Sexual activity. Diet, exercise, and sleep habits. Work and work environment. Access to firearms. Method of birth control. Menstrual cycle. Pregnancy history. What immunizations do I need?  Vaccines are usually given at various ages, according to a schedule. Your health care provider will recommend vaccines for you based on your age, medicalhistory, and lifestyle or other factors, such as travel or where you work. What tests do I need? Blood tests Lipid and cholesterol levels. These may be checked every 5 years, or more often if you are over 50 years old. Hepatitis C  test. Hepatitis B test. Screening Lung cancer screening. You may have this screening every year starting at age 55 if you have a 30-pack-year history of smoking and currently smoke or have quit within the past 15 years. Colorectal cancer screening. All adults should have this screening starting at age 50 and continuing until age 75. Your health care provider may recommend screening at age 45 if you are at increased risk. You will have tests every 1-10 years, depending on your results and the type of screening test. Diabetes screening. This is done by checking your blood sugar (glucose) after you have not eaten for a while (fasting). You may have this done every 1-3 years. Mammogram. This may be done every 1-2 years. Talk with your health care provider about when you should start having regular mammograms. This may depend on whether you have a family history of breast cancer. BRCA-related cancer screening. This may be done if you have a family history of breast, ovarian, tubal, or peritoneal cancers. Pelvic exam and Pap test. This may be done every 3 years starting at age 21. Starting at age 30, this may be done every   5 years if you have a Pap test in combination with an HPV test. Other tests STD (sexually transmitted disease) testing, if you are at risk. Bone density scan. This is done to screen for osteoporosis. You may have this scan if you are at high risk for osteoporosis. Talk with your health care provider about your test results, treatment options,and if necessary, the need for more tests. Follow these instructions at home: Eating and drinking  Eat a diet that includes fresh fruits and vegetables, whole grains, lean protein, and low-fat dairy products. Take vitamin and mineral supplements as recommended by your health care provider. Do not drink alcohol if: Your health care provider tells you not to drink. You are pregnant, may be pregnant, or are planning to become pregnant. If  you drink alcohol: Limit how much you have to 0-1 drink a day. Be aware of how much alcohol is in your drink. In the [place] one drink equals one 12 oz bottle of beer (355 mL), one 5 oz glass of wine (148 mL), or one 1 oz glass of hard liquor (44 mL).  Lifestyle Take daily care of your teeth and gums. Brush your teeth every morning and night with fluoride toothpaste. Floss one time each day. Stay active. Exercise for at least 30 minutes 5 or more days each week. Do not use any products that contain nicotine or tobacco, such as cigarettes, e-cigarettes, and chewing tobacco. If you need help quitting, ask your health care provider. Do not use drugs. If you are sexually active, practice safe sex. Use a condom or other form of protection to prevent STIs (sexually transmitted infections). If you do not wish to become pregnant, use a form of birth control. If you plan to become pregnant, see your health care provider for a prepregnancy visit. If told by your health care provider, take low-dose aspirin daily starting at age 50. Find healthy ways to cope with stress, such as: Meditation, yoga, or listening to music. Journaling. Talking to a trusted person. Spending time with friends and family. Safety Always wear your seat belt while driving or riding in a vehicle. Do not drive: If you have been drinking alcohol. Do not ride with someone who has been drinking. When you are tired or distracted. While texting. Wear a helmet and other protective equipment during sports activities. If you have firearms in your house, make sure you follow all gun safety procedures. What's next? Visit your health care provider once a year for an annual wellness visit. Ask your health care provider how often you should have your eyes and teeth checked. Stay up to date on all vaccines. This information is not intended to replace advice given to you by your health care provider. Make sure you discuss any questions you  have with your healthcare provider. Document Revised: 05/15/2020 Document Reviewed: 04/22/2018 Elsevier Patient Education  2022 Elsevier Inc.  

## 2021-03-21 NOTE — Progress Notes (Signed)
GYNECOLOGY ANNUAL PHYSICAL EXAM PROGRESS NOTE  Subjective:    Elizabeth Bridges is a 40 y.o. [phone] female with h/o fibroid uterus who presents for an annual exam.  The patient is sexually active. The patient wears seatbelts: yes. The patient participates in regular exercise: yes. Has the patient ever been transfused or tattooed?: no. The patient reports that there is not domestic violence in her life.    The patient has the following complaints today. Reports that overall her cycles have improved on Depo-Provera (previously transition from OCPs).  She does note in May she had an episode of heavy bleeding for 2 days that subsided.  Notes that her cramping is better, however is now felt more in the back instead of at the front.  Gynecologic History Menarche age: 34 No LMP recorded. Patient has had an injection. Contraception: OCP (estrogen/progesterone) History of STI's: Trichomoniasis, 2016 Last Pap: 02/15/2019. Results were: normal.  Denies h/o abnormal pap smears. Mammogram-ordered.   OB History  Gravida Para Term Preterm AB Living  '4 2 2 '$ 0 2 2  SAB IAB Ectopic Multiple Live Births  2 0 0 0 2    # Outcome Date GA Lbr Len/2nd Weight Sex Delivery Anes PTL Lv  4 Term 2013   6 lb 4 oz (2.835 kg) M Vag-Spont     3 Term 2003   6 lb 12 oz (3.062 kg) F Vag-Spont   LIV  2 SAB           1 SAB             Past Medical History:  Diagnosis Date   Dysmenorrhea    History of trichomoniasis    Uterine fibroid     Past Surgical History:  Procedure Laterality Date   DILATION AND CURETTAGE OF UTERUS     x 2    Family History  Problem Relation Age of Onset   Asthma Mother    Hypertension Father    Heart disease Father    Diabetes Father     Social History   Socioeconomic History   Marital status: Single    Spouse name: Not on file   Number of children: Not on file   Years of education: Not on file   Highest education level: Not on file  Occupational History   Not on  file  Tobacco Use   Smoking status: Never   Smokeless tobacco: Never  Vaping Use   Vaping Use: Never used  Substance and Sexual Activity   Alcohol use: No   Drug use: No   Sexual activity: Yes    Birth control/protection: Pill  Other Topics Concern   Not on file  Social History Narrative   Not on file   Social Determinants of Health   Financial Resource Strain: Not on file  Food Insecurity: Not on file  Transportation Needs: Not on file  Physical Activity: Not on file  Stress: Not on file  Social Connections: Not on file  Intimate Partner Violence: Not on file    Current Outpatient Medications on File Prior to Visit  Medication Sig Dispense Refill   clindamycin (CLEOCIN T) 1 % lotion      medroxyPROGESTERone (DEPO-PROVERA) 150 MG/ML injection Inject 1 mL (150 mg total) into the muscle every 3 (three) months. 1 mL 3   Misc Natural Products (APPLE CIDER VINEGAR DIET PO) Take by mouth.     tretinoin (RETIN-A) 0.025 % cream Apply topically.     Current  Facility-Administered Medications on File Prior to Visit  Medication Dose Route Frequency Provider Last Rate Last Admin   medroxyPROGESTERone (DEPO-PROVERA) injection 150 mg  150 mg Intramuscular Q90 days Rubie Maid, MD   150 mg at 01/11/21 0848    No Known Allergies    Review of Systems Constitutional: negative for chills, fatigue, fevers and sweats Eyes: negative for irritation, redness and visual disturbance Ears, nose, mouth, throat, and face: negative for hearing loss, nasal congestion, snoring and tinnitus Respiratory: negative for asthma, cough, sputum Cardiovascular: negative for chest pain, dyspnea, exertional chest pressure/discomfort, irregular heart beat, palpitations and syncope Gastrointestinal: negative for abdominal pain, change in bowel habits, nausea and vomiting Genitourinary:  Negative for genital lesions, sexual problems, dysuria and urinary incontinence Integument/breast: negative for breast lump,  breast tenderness and nipple discharge Hematologic/lymphatic: negative for bleeding and easy bruising Musculoskeletal:negative for back pain and muscle weakness Neurological: negative for dizziness, headaches, vertigo and weakness Endocrine: negative for diabetic symptoms including polydipsia, polyuria and skin dryness Allergic/Immunologic: negative for hay fever and urticaria        Objective:  Blood pressure 120/86, pulse 88, height '5\' 5"'$  (1.651 m), weight 224 lb (101.6 kg).  Body mass index is 37.28 kg/m.   General Appearance:    Alert, cooperative, no distress, appears stated age, moderate obesity  Head:    Normocephalic, without obvious abnormality, atraumatic  Eyes:    PERRL, conjunctiva/corneas clear, EOM's intact, both eyes  Ears:    Normal external ear canals, both ears  Nose:   Nares normal, septum midline, mucosa normal, no drainage or sinus tenderness  Throat:   Lips, mucosa, and tongue normal; teeth and gums normal  Neck:   Supple, symmetrical, trachea midline, no adenopathy; thyroid: no enlargement/tenderness/nodules; no carotid bruit or JVD  Back:     Symmetric, no curvature, ROM normal, no CVA tenderness  Lungs:     Clear to auscultation bilaterally, respirations unlabored  Chest Wall:    No tenderness or deformity   Heart:    Regular rate and rhythm, S1 and S2 normal, no murmur, rub or gallop  Breast Exam:    No tenderness, masses, or nipple abnormality  Abdomen:     Soft, non-tender, bowel sounds active all four quadrants, no masses, no organomegaly.    Genitalia:    Pelvic:external genitalia normal, vagina without lesions, discharge, or tenderness, rectovaginal septum  normal. Cervix normal in appearance, no cervical motion tenderness, no adnexal masses or tenderness.  Uterus normal size, shape, mobile, regular contours, nontender.  Rectal:    Normal external sphincter.  No hemorrhoids appreciated. Internal exam not done.   Extremities:   Extremities normal,  atraumatic, no cyanosis or edema  Pulses:   2+ and symmetric all extremities  Skin:   Skin color, texture, turgor normal, no rashes or lesions  Lymph nodes:   Cervical, supraclavicular, and axillary nodes normal  Neurologic:   CNII-XII intact, normal strength, sensation and reflexes throughout   .  Labs:   Lab Results  Component Value Date   WBC 8.3 10/29/2018   HGB 12.6 10/29/2018   HCT 39.1 10/29/2018   MCV 90.9 10/29/2018   PLT 255 10/29/2018    Lab Results  Component Value Date   CREATININE 0.85 10/29/2018   BUN 15 10/29/2018   NA 139 10/29/2018   K 3.8 10/29/2018   CL 104 10/29/2018   CO2 26 10/29/2018    Lab Results  Component Value Date   ALT 14 02/15/2019   AST  11 02/15/2019   ALKPHOS 69 02/15/2019   BILITOT 0.2 02/15/2019    Lab Results  Component Value Date   TSH 1.810 02/09/2018    Lipid Panel     Component Value Date/Time   CHOL 179 02/09/2018 0904   TRIG 77 02/09/2018 0904   HDL 76 02/09/2018 0904   CHOLHDL 2.4 02/09/2018 0904   LDLCALC 88 02/09/2018 0904     Assessment:   Well woman exam with routine gynecological exam H/O vitamin D deficiency Obesity (BMI 35.0-39.9 without comorbidity)  Fibroid uterus Surveillance of injectable contraception  Plan:    - Blood tests: CBC, CMP, HgbA1c, Vitamin D - Breast self exam technique reviewed and patient encouraged to perform self-exam monthly. - Contraception: Patient on Depo Provera and noted improvement in abnormal heavy bleeding. Refill given. Continue q 3 month injections.  - Mammogram ordered.  - Discussed healthy lifestyle modifications. - Pap smear up to date. Next due in 2023.  - Fibroid uterus and history of menorrhagia being managed with Depo Provera.  - Has completed COVID vaccination series.  - Follow up in 1 year for annual exam.    Rubie Maid, MD [facility]

## 2021-03-22 ENCOUNTER — Encounter: Payer: BC Managed Care – PPO | Admitting: Obstetrics and Gynecology

## 2021-03-22 LAB — COMPREHENSIVE METABOLIC PANEL
ALT: 25 IU/L (ref 0–32)
AST: 19 IU/L (ref 0–40)
Albumin/Globulin Ratio: 1.7 (ref 1.2–2.2)
Albumin: 4.5 g/dL (ref 3.8–4.8)
Alkaline Phosphatase: 87 IU/L (ref 44–121)
BUN/Creatinine Ratio: 12 (ref 9–23)
BUN: 10 mg/dL (ref 6–24)
Bilirubin Total: 0.3 mg/dL (ref 0.0–1.2)
CO2: 23 mmol/L (ref 20–29)
Calcium: 9.8 mg/dL (ref 8.7–10.2)
Chloride: 103 mmol/L (ref 96–106)
Creatinine, Ser: 0.84 mg/dL (ref 0.57–1.00)
Globulin, Total: 2.7 g/dL (ref 1.5–4.5)
Glucose: 80 mg/dL (ref 65–99)
Potassium: 4.1 mmol/L (ref 3.5–5.2)
Sodium: 141 mmol/L (ref 134–144)
Total Protein: 7.2 g/dL (ref 6.0–8.5)
eGFR: 90 mL/min/{1.73_m2} (ref >59)

## 2021-03-22 LAB — HEMOGLOBIN A1C
Est. average glucose Bld gHb Est-mCnc: 111 mg/dL
Hgb A1c MFr Bld: 5.5 % (ref 4.8–5.6)

## 2021-03-22 LAB — CBC
Hematocrit: 40.4 % (ref 34.0–46.6)
Hemoglobin: 13.4 g/dL (ref 11.1–15.9)
MCH: 29.6 pg (ref 26.6–33.0)
MCHC: 33.2 g/dL (ref 31.5–35.7)
MCV: 89 fL (ref 79–97)
Platelets: 217 10*3/uL (ref 150–450)
RBC: 4.53 x10E6/uL (ref 3.77–5.28)
RDW: 12.4 % (ref 11.7–15.4)
WBC: 5.8 10*3/uL (ref 3.4–10.8)

## 2021-03-22 LAB — VITAMIN D 25 HYDROXY (VIT D DEFICIENCY, FRACTURES): Vit D, 25-Hydroxy: 17.7 ng/mL — ABNORMAL LOW (ref 30.0–100.0)

## 2021-03-24 ENCOUNTER — Other Ambulatory Visit: Payer: Self-pay | Admitting: Obstetrics and Gynecology

## 2021-03-24 MED ORDER — VITAMIN D (ERGOCALCIFEROL) 1.25 MG (50000 UNIT) PO CAPS: 50000.0000 [IU] | ORAL_CAPSULE | ORAL | 0 refills | Status: DC

## 2021-04-02 ENCOUNTER — Other Ambulatory Visit: Payer: Self-pay

## 2021-04-02 ENCOUNTER — Ambulatory Visit (INDEPENDENT_AMBULATORY_CARE_PROVIDER_SITE_OTHER): Payer: BC Managed Care – PPO | Admitting: Obstetrics and Gynecology

## 2021-04-02 DIAGNOSIS — Z3042 Encounter for surveillance of injectable contraceptive: Secondary | ICD-10-CM

## 2021-04-02 NOTE — Progress Notes (Signed)
Date last pap: 02/15/2019. Last Depo-Provera: 01/11/2021. Side Effects if any: None. Serum HCG indicated? Not Applicable. Depo-Provera 150 mg IM given by: Cristy Folks, CMA. Next appointment due Oct. 25 - Nov. 8

## 2021-06-20 ENCOUNTER — Ambulatory Visit: Payer: BC Managed Care – PPO

## 2021-06-27 ENCOUNTER — Ambulatory Visit (INDEPENDENT_AMBULATORY_CARE_PROVIDER_SITE_OTHER): Payer: BC Managed Care – PPO | Admitting: Obstetrics and Gynecology

## 2021-06-27 ENCOUNTER — Other Ambulatory Visit: Payer: Self-pay

## 2021-06-27 DIAGNOSIS — Z3042 Encounter for surveillance of injectable contraceptive: Secondary | ICD-10-CM | POA: Diagnosis not present

## 2021-06-27 NOTE — Progress Notes (Signed)
Date last pap: 02/15/2019. Last Depo-Provera: 02/23/2021. Side Effects if any: None. Serum HCG indicated? N/A. Depo-Provera 150 mg IM given by: Cristy Folks, CMA. Next appointment due: Jan 19 - Feb. 2

## 2021-09-13 ENCOUNTER — Other Ambulatory Visit: Payer: Self-pay

## 2021-09-13 ENCOUNTER — Ambulatory Visit (INDEPENDENT_AMBULATORY_CARE_PROVIDER_SITE_OTHER): Payer: BC Managed Care – PPO | Admitting: Obstetrics and Gynecology

## 2021-09-13 DIAGNOSIS — Z3042 Encounter for surveillance of injectable contraceptive: Secondary | ICD-10-CM

## 2021-09-13 NOTE — Progress Notes (Signed)
Date last pap: 02/15/2019. Last Depo-Provera: 02/23/2021. Side Effects if any: None. Serum HCG indicated? N/A. Depo-Provera 150 mg IM given by: Cristy Folks, CMA. Next appointment due: April 6 - April 20

## 2021-09-19 ENCOUNTER — Ambulatory Visit: Payer: BC Managed Care – PPO

## 2021-12-06 ENCOUNTER — Ambulatory Visit (INDEPENDENT_AMBULATORY_CARE_PROVIDER_SITE_OTHER): Payer: BC Managed Care – PPO | Admitting: Obstetrics and Gynecology

## 2021-12-06 DIAGNOSIS — Z3042 Encounter for surveillance of injectable contraceptive: Secondary | ICD-10-CM | POA: Diagnosis not present

## 2021-12-06 NOTE — Progress Notes (Signed)
Date last pap: 02/15/2019 ?Last Depo-Provera: 09/13/2021 ?Side Effects if any: None. ?Serum HCG indicated? N/A. ?Depo-Provera 150 mg IM given by: Cristy Folks, CMA. ?Next appointment due: June 30 - July 14 ?Patient advised to schedule annual exam due: 03/21/2022 ?

## 2021-12-06 NOTE — Patient Instructions (Signed)
Medroxyprogesterone Injection (Contraception) ?What is this medication? ?MEDROXYPROGESTERONE (me DROX ee proe JES te rone) prevents ovulation and pregnancy. It belongs to a group of medications called contraceptives. This medication is a progestin hormone. ?This medicine may be used for other purposes; ask your health care provider or pharmacist if you have questions. ?COMMON BRAND NAME(S): Depo-Provera, Depo-subQ Provera 104 ?What should I tell my care team before I take this medication? ?They need to know if you have any of these conditions: ?Asthma ?Blood clots ?Breast cancer or family history of breast cancer ?Depression ?Diabetes ?Eating disorder (anorexia nervosa) ?Heart attack ?High blood pressure ?HIV infection or AIDS ?If you often drink alcohol ?Kidney disease ?Liver disease ?Migraine headaches ?Osteoporosis, weak bones ?Seizures ?Stroke ?Tobacco smoker ?Vaginal bleeding ?An unusual or allergic reaction to medroxyprogesterone, other hormones, medications, foods, dyes, or preservatives ?Pregnant or trying to get pregnant ?Breast-feeding ?How should I use this medication? ?Depo-Provera CI contraceptive injection is given into a muscle. Depo-subQ Provera 104 injection is given under the skin. It is given in a hospital or clinic setting. The injection is usually given during the first 5 days after the start of a menstrual period or 6 weeks after delivery of a baby. ?A patient package insert for the product will be given with each prescription and refill. Be sure to read this information carefully each time. The sheet may change often. ?Talk to your care team about the use of this medication in children. Special care may be needed. These injections have been used in female children who have started having menstrual periods. ?Overdosage: If you think you have taken too much of this medicine contact a poison control center or emergency room at once. ?NOTE: This medicine is only for you. Do not share this medicine  with others. ?What if I miss a dose? ?Keep appointments for follow-up doses. You must get an injection once every 3 months. It is important not to miss your dose. Call your care team if you are unable to keep an appointment. ?What may interact with this medication? ?Antibiotics or medications for infections, especially rifampin and griseofulvin ?Antivirals for HIV or hepatitis ?Aprepitant ?Armodafinil ?Bexarotene ?Bosentan ?Medications for seizures like carbamazepine, felbamate, oxcarbazepine, phenytoin, phenobarbital, primidone, topiramate ?Mitotane ?Modafinil ?St. John's wort ?This list may not describe all possible interactions. Give your health care provider a list of all the medicines, herbs, non-prescription drugs, or dietary supplements you use. Also tell them if you smoke, drink alcohol, or use illegal drugs. Some items may interact with your medicine. ?What should I watch for while using this medication? ?This medication does not protect you against HIV infection (AIDS) or other sexually transmitted diseases. ?Use of this product may cause you to lose calcium from your bones. Loss of calcium may cause weak bones (osteoporosis). Only use this product for more than 2 years if other forms of birth control are not right for you. The longer you use this product for birth control the more likely you will be at risk for weak bones. Ask your care team how you can keep strong bones. ?You may have a change in bleeding pattern or irregular periods. Many females stop having periods while taking this medication. ?If you have received your injections on time, your chance of being pregnant is very low. If you think you may be pregnant, see your care team as soon as possible. ?Tell your care team if you want to get pregnant within the next year. The effect of this medication may last a   long time after you get your last injection. ?What side effects may I notice from receiving this medication? ?Side effects that you should  report to your care team as soon as possible: ?Allergic reactions--skin rash, itching, hives, swelling of the face, lips, tongue, or throat ?Blood clot--pain, swelling, or warmth in the leg, shortness of breath, chest pain ?Gallbladder problems--severe stomach pain, nausea, vomiting, fever ?Increase in blood pressure ?Liver injury--right upper belly pain, loss of appetite, nausea, light-colored stool, dark yellow or brown urine, yellowing skin or eyes, unusual weakness or fatigue ?New or worsening migraines or headaches ?Seizures ?Stroke--sudden numbness or weakness of the face, arm, or leg, trouble speaking, confusion, trouble walking, loss of balance or coordination, dizziness, severe headache, change in vision ?Unusual vaginal discharge, itching, or odor ?Worsening mood, feelings of depression ?Side effects that usually do not require medical attention (report to your care team if they continue or are bothersome): ?Breast pain or tenderness ?Dark patches of the skin on the face or other sun-exposed areas ?Irregular menstrual cycles or spotting ?Nausea ?Weight gain ?This list may not describe all possible side effects. Call your doctor for medical advice about side effects. You may report side effects to FDA at 1-800-FDA-1088. ?Where should I keep my medication? ?This injection is only given by a care team. It will not be stored at home. ?NOTE: This sheet is a summary. It may not cover all possible information. If you have questions about this medicine, talk to your doctor, pharmacist, or health care provider. ?? 2023 Elsevier/Gold Standard (2020-10-14 00:00:00) ? ?

## 2022-02-12 NOTE — Progress Notes (Deleted)
Date last pap: 02/15/2019 Last Depo-Provera: 12/06/2021 Side Effects if any: None. Serum HCG indicated? N/A. Depo-Provera  Next appointment due:  SEPT 15-SEPT 30

## 2022-02-13 ENCOUNTER — Telehealth: Payer: Self-pay | Admitting: Obstetrics and Gynecology

## 2022-02-13 DIAGNOSIS — Z3042 Encounter for surveillance of injectable contraceptive: Secondary | ICD-10-CM

## 2022-02-13 MED ORDER — MEDROXYPROGESTERONE ACETATE 150 MG/ML IM SUSP: 150.0000 mg | INTRAMUSCULAR | 0 refills | Status: DC

## 2022-02-13 NOTE — Telephone Encounter (Signed)
Patient called, has an injection appt for depo on June 30th, went to pick up medication from pharmacy and has no refills. Asking for RX to be sent. Please advise

## 2022-02-14 ENCOUNTER — Ambulatory Visit: Payer: BC Managed Care – PPO | Admitting: Family

## 2022-02-14 ENCOUNTER — Ambulatory Visit (INDEPENDENT_AMBULATORY_CARE_PROVIDER_SITE_OTHER)
Admission: RE | Admit: 2022-02-14 | Discharge: 2022-02-14 | Disposition: A | Discharge status: Home / Self Care | Payer: BC Managed Care – PPO | Source: Ambulatory Visit | Attending: Family | Admitting: Family

## 2022-02-14 ENCOUNTER — Encounter: Payer: Self-pay | Admitting: Family

## 2022-02-14 VITALS — BP 128/78 | HR 77 | Temp 99.2°F | Resp 16 | Ht 65.0 in | Wt 236.2 lb

## 2022-02-14 DIAGNOSIS — E559 Vitamin D deficiency, unspecified: Secondary | ICD-10-CM

## 2022-02-14 DIAGNOSIS — E669 Obesity, unspecified: Secondary | ICD-10-CM | POA: Diagnosis not present

## 2022-02-14 DIAGNOSIS — Z1231 Encounter for screening mammogram for malignant neoplasm of breast: Secondary | ICD-10-CM | POA: Diagnosis not present

## 2022-02-14 DIAGNOSIS — E785 Hyperlipidemia, unspecified: Secondary | ICD-10-CM

## 2022-02-14 DIAGNOSIS — M62838 Other muscle spasm: Secondary | ICD-10-CM

## 2022-02-14 DIAGNOSIS — M542 Cervicalgia: Secondary | ICD-10-CM

## 2022-02-14 MED ORDER — NAPROXEN 500 MG PO TABS: ORAL_TABLET | ORAL | 0 refills | Status: AC

## 2022-02-14 MED ORDER — CYCLOBENZAPRINE HCL 10 MG PO TABS: 10.0000 mg | ORAL_TABLET | Freq: Three times a day (TID) | ORAL | 0 refills | Status: AC | PRN

## 2022-02-14 NOTE — Progress Notes (Signed)
New Patient Office Visit  Subjective:  Patient ID: Elizabeth Bridges, female    DOB: 02-Sep-1980  Age: 41 y.o. MRN: [id number]  CC:  Chief Complaint  Patient presents with  . Establish Care    HPI Elizabeth Bridges is here to establish care as a new patient.  Prior provider was: Hildred Laser, MD obgyn  Pt is with acute concerns.   Left shoulder pain on and off over the last few years. Went to ER in the past thought it was her heart, had work up and was normal workup. She is left handed , dominant. Has a tingling sensation on left arm and sometimes her left two fingers will start to tingle and will radiate at base of her neck and front of her lower chest. Neck without painful ROM.   For work she sits often and lots of typing Will occasionally lift things at times but not overly often Prior to this job she used her hands often, lifting and folding things in boxes.     Past Medical History:  Diagnosis Date  . Dysmenorrhea   . History of trichomoniasis   . Uterine fibroid     Past Surgical History:  Procedure Laterality Date  . DILATION AND CURETTAGE OF UTERUS     x 2    Family History  Problem Relation Age of Onset  . Asthma Mother   . Hypertension Father   . Heart disease Father   . Diabetes Father     Social History   Socioeconomic History  . Marital status: Significant Other    Spouse name: Not on file  . Number of children: 2  . Years of education: Not on file  . Highest education level: Not on file  Occupational History    Employer: [employer]  Tobacco Use  . Smoking status: Never  . Smokeless tobacco: Never  Vaping Use  . Vaping Use: Never used  Substance and Sexual Activity  . Alcohol use: Yes    Comment: rarely  . Drug use: No  . Sexual activity: Yes    Partners: Male    Birth control/protection: Injection  Other Topics Concern  . Not on file  Social History Narrative   One girl one boy    89, 71   Engaged    Social Determinants of Garment/textile technologist: Not on file  Food Insecurity: Not on file  Transportation Needs: Not on file  Physical Activity: Not on file  Stress: Not on file  Social Connections: Not on file  Intimate Partner Violence: Not on file    Outpatient Medications Prior to Visit  Medication Sig Dispense Refill  . medroxyPROGESTERone (DEPO-PROVERA) 150 MG/ML injection Inject 1 mL (150 mg total) into the muscle every 3 (three) months. 1 mL 0  . Multiple Vitamin (MULTIVITAMIN) capsule Take 1 capsule by mouth daily.    Marland Kitchen tretinoin (RETIN-A) 0.025 % cream Apply topically.    . clindamycin (CLEOCIN T) 1 % lotion     . Misc Natural Products (APPLE CIDER VINEGAR DIET PO) Take by mouth. (Patient not taking: Reported on 02/14/2022)    . Vitamin D, Ergocalciferol, (DRISDOL) 1.25 MG (50000 UNIT) CAPS capsule Take 1 capsule (50,000 Units total) by mouth every 7 (seven) days. (Patient not taking: Reported on 02/14/2022) 12 capsule 0   Facility-Administered Medications Prior to Visit  Medication Dose Route Frequency Provider Last Rate Last Admin  . medroxyPROGESTERone (DEPO-PROVERA) injection 150 mg  150 mg Intramuscular  Q90 days Hildred Laser, MD   150 mg at 12/06/21 0842    No Known Allergies      Objective:    Physical Exam Vitals reviewed.  Constitutional:      General: She is not in acute distress.    Appearance: Normal appearance. She is obese. She is not ill-appearing, toxic-appearing or diaphoretic.  HENT:     Right Ear: Tympanic membrane normal.     Left Ear: Tympanic membrane normal.     Mouth/Throat:     Mouth: Mucous membranes are moist.     Pharynx: No pharyngeal swelling.     Tonsils: No tonsillar exudate.  Eyes:     Extraocular Movements: Extraocular movements intact.     Conjunctiva/sclera: Conjunctivae normal.     Pupils: Pupils are equal, round, and reactive to light.  Neck:     Thyroid: No thyroid mass.  Cardiovascular:     Rate and Rhythm: Normal rate and regular  rhythm.  Pulmonary:     Effort: Pulmonary effort is normal.     Breath sounds: Normal breath sounds.  Abdominal:     General: Abdomen is flat. Bowel sounds are normal.     Palpations: Abdomen is soft.  Musculoskeletal:     Right shoulder: Normal range of motion.     Left shoulder: No tenderness. Normal range of motion.     Cervical back: No erythema or crepitus. Pain with movement (with left rotation as well as slight with flexion) and muscular tenderness (left posterior base of neck with tightness and spasm) present. No spinous process tenderness. Decreased range of motion.     Comments: Pain felt posterior base of neck with external rotation left shoulder as well as slight pain base of neck with apley scratch test. No shoulder or AC joint pain  Lymphadenopathy:     Cervical:     Right cervical: No superficial cervical adenopathy.    Left cervical: No superficial cervical adenopathy.  Skin:    General: Skin is warm.     Capillary Refill: Capillary refill takes less than 2 seconds.  Neurological:     General: No focal deficit present.     Mental Status: She is alert and oriented to person, place, and time.  Psychiatric:        Mood and Affect: Mood normal.        Behavior: Behavior normal.        Thought Content: Thought content normal.        Judgment: Judgment normal.      BP 128/78   Pulse 77   Temp 99.2 F (37.3 C)   Resp 16   Ht 5\' 5"  (1.651 m)   Wt 236 lb 4 oz (107.2 kg)   SpO2 99%   BMI 39.31 kg/m  Wt Readings from Last 3 Encounters:  02/14/22 236 lb 4 oz (107.2 kg)  03/21/21 224 lb (101.6 kg)  08/02/20 220 lb 1 oz (99.8 kg)     Health Maintenance Due  Topic Date Due  . Hepatitis C Screening  Never done  . PAP SMEAR-Modifier  02/14/2022    There are no preventive care reminders to display for this patient.  Lab Results  Component Value Date   TSH 1.810 02/09/2018   Lab Results  Component Value Date   WBC 5.8 03/21/2021   HGB 13.4 03/21/2021   HCT  40.4 03/21/2021   MCV 89 03/21/2021   PLT 217 03/21/2021   Lab Results  Component Value Date  NA 141 03/21/2021   K 4.1 03/21/2021   CO2 23 03/21/2021   GLUCOSE 80 03/21/2021   BUN 10 03/21/2021   CREATININE 0.84 03/21/2021   BILITOT 0.3 03/21/2021   ALKPHOS 87 03/21/2021   AST 19 03/21/2021   ALT 25 03/21/2021   PROT 7.2 03/21/2021   ALBUMIN 4.5 03/21/2021   CALCIUM 9.8 03/21/2021   ANIONGAP 9 10/29/2018   EGFR 90 03/21/2021   Lab Results  Component Value Date   CHOL 179 02/09/2018   Lab Results  Component Value Date   HDL 76 02/09/2018   Lab Results  Component Value Date   LDLCALC 88 02/09/2018   Lab Results  Component Value Date   TRIG 77 02/09/2018   Lab Results  Component Value Date   CHOLHDL 2.4 02/09/2018   Lab Results  Component Value Date   HGBA1C 5.5 03/21/2021      Assessment & Plan:   Problem List Items Addressed This Visit       Other   Obesity (BMI 35.0-39.9 without comorbidity)    Work on diet and exercise as tolerated      Relevant Orders   Comprehensive metabolic panel   Lipid panel   CBC with Differential/Platelet   Vitamin D deficiency - Primary    Ordered vitamin d pending results.  Recommend daily vitamin D3 2000 IU once daily      Relevant Orders   VITAMIN D 25 Hydroxy (Vit-D Deficiency, Fractures)   Encounter for screening mammogram for malignant neoplasm of breast    Mammogram ordered. Pending results.       Relevant Orders   MM DIAG BREAST TOMO BILATERAL   Cervicalgia    Neck xray ordered pending results      Relevant Medications   cyclobenzaprine (FLEXERIL) 10 MG tablet   naproxen (NAPROSYN) 500 MG tablet   Other Relevant Orders   DG Cervical Spine Complete   Muscle spasm    trial flexeril 10 mg qhs prn muscle spasm rx naproxen 500 mg po bid prn muscle pain  Work on neck exercises, print out given to pt Heat to site, massage as tolerated Work on appropriate posture Neck xray today pending results       Relevant Medications   cyclobenzaprine (FLEXERIL) 10 MG tablet   Elevated lipids    Repeat lipid panel pending results Work on low chol diet exercise as tolerated      Relevant Orders   Lipid panel    Meds ordered this encounter  Medications  . cyclobenzaprine (FLEXERIL) 10 MG tablet    Sig: Take 1 tablet (10 mg total) by mouth 3 (three) times daily as needed for muscle spasms.    Dispense:  30 tablet    Refill:  0    Order Specific Question:   Supervising Provider    Answer:   BEDSOLE, AMY E [[id number]]  . naproxen (NAPROSYN) 500 MG tablet    Sig: Take one po twice daily prn muscle pain    Dispense:  30 tablet    Refill:  0    Order Specific Question:   Supervising Provider    Answer:   Ermalene Searing, AMY E [[id number]]    Follow-up: Return in about 2 weeks (around 02/28/2022) for if no improvement in neck pain.    Mort Sawyers, FNP

## 2022-02-21 ENCOUNTER — Ambulatory Visit: Payer: BC Managed Care – PPO

## 2022-02-27 NOTE — Progress Notes (Unsigned)
Date last pap: 02/15/2019 Last Depo-Provera: 09/13/2021 Side Effects if any: None. Serum HCG indicated? N/A. Depo-Provera 150 mg IM given by: Cristy Folks, CMA. Next appointment due: September 22 - October 6 Patient advised to schedule annual exam due: 03/21/2022

## 2022-02-27 NOTE — Patient Instructions (Incomplete)
Medroxyprogesterone Injection (Contraception) ?What is this medication? ?MEDROXYPROGESTERONE (me DROX ee proe JES te rone) prevents ovulation and pregnancy. It belongs to a group of medications called contraceptives. This medication is a progestin hormone. ?This medicine may be used for other purposes; ask your health care provider or pharmacist if you have questions. ?COMMON BRAND NAME(S): Depo-Provera, Depo-subQ Provera 104 ?What should I tell my care team before I take this medication? ?They need to know if you have any of these conditions: ?Asthma ?Blood clots ?Breast cancer or family history of breast cancer ?Depression ?Diabetes ?Eating disorder (anorexia nervosa) ?Heart attack ?High blood pressure ?HIV infection or AIDS ?If you often drink alcohol ?Kidney disease ?Liver disease ?Migraine headaches ?Osteoporosis, weak bones ?Seizures ?Stroke ?Tobacco smoker ?Vaginal bleeding ?An unusual or allergic reaction to medroxyprogesterone, other hormones, medications, foods, dyes, or preservatives ?Pregnant or trying to get pregnant ?Breast-feeding ?How should I use this medication? ?Depo-Provera CI contraceptive injection is given into a muscle. Depo-subQ Provera 104 injection is given under the skin. It is given in a hospital or clinic setting. The injection is usually given during the first 5 days after the start of a menstrual period or 6 weeks after delivery of a baby. ?A patient package insert for the product will be given with each prescription and refill. Be sure to read this information carefully each time. The sheet may change often. ?Talk to your care team about the use of this medication in children. Special care may be needed. These injections have been used in female children who have started having menstrual periods. ?Overdosage: If you think you have taken too much of this medicine contact a poison control center or emergency room at once. ?NOTE: This medicine is only for you. Do not share this medicine  with others. ?What if I miss a dose? ?Keep appointments for follow-up doses. You must get an injection once every 3 months. It is important not to miss your dose. Call your care team if you are unable to keep an appointment. ?What may interact with this medication? ?Antibiotics or medications for infections, especially rifampin and griseofulvin ?Antivirals for HIV or hepatitis ?Aprepitant ?Armodafinil ?Bexarotene ?Bosentan ?Medications for seizures like carbamazepine, felbamate, oxcarbazepine, phenytoin, phenobarbital, primidone, topiramate ?Mitotane ?Modafinil ?St. John's wort ?This list may not describe all possible interactions. Give your health care provider a list of all the medicines, herbs, non-prescription drugs, or dietary supplements you use. Also tell them if you smoke, drink alcohol, or use illegal drugs. Some items may interact with your medicine. ?What should I watch for while using this medication? ?This medication does not protect you against HIV infection (AIDS) or other sexually transmitted diseases. ?Use of this product may cause you to lose calcium from your bones. Loss of calcium may cause weak bones (osteoporosis). Only use this product for more than 2 years if other forms of birth control are not right for you. The longer you use this product for birth control the more likely you will be at risk for weak bones. Ask your care team how you can keep strong bones. ?You may have a change in bleeding pattern or irregular periods. Many females stop having periods while taking this medication. ?If you have received your injections on time, your chance of being pregnant is very low. If you think you may be pregnant, see your care team as soon as possible. ?Tell your care team if you want to get pregnant within the next year. The effect of this medication may last a   long time after you get your last injection. ?What side effects may I notice from receiving this medication? ?Side effects that you should  report to your care team as soon as possible: ?Allergic reactions--skin rash, itching, hives, swelling of the face, lips, tongue, or throat ?Blood clot--pain, swelling, or warmth in the leg, shortness of breath, chest pain ?Gallbladder problems--severe stomach pain, nausea, vomiting, fever ?Increase in blood pressure ?Liver injury--right upper belly pain, loss of appetite, nausea, light-colored stool, dark yellow or brown urine, yellowing skin or eyes, unusual weakness or fatigue ?New or worsening migraines or headaches ?Seizures ?Stroke--sudden numbness or weakness of the face, arm, or leg, trouble speaking, confusion, trouble walking, loss of balance or coordination, dizziness, severe headache, change in vision ?Unusual vaginal discharge, itching, or odor ?Worsening mood, feelings of depression ?Side effects that usually do not require medical attention (report to your care team if they continue or are bothersome): ?Breast pain or tenderness ?Dark patches of the skin on the face or other sun-exposed areas ?Irregular menstrual cycles or spotting ?Nausea ?Weight gain ?This list may not describe all possible side effects. Call your doctor for medical advice about side effects. You may report side effects to FDA at 1-800-FDA-1088. ?Where should I keep my medication? ?This injection is only given by a care team. It will not be stored at home. ?NOTE: This sheet is a summary. It may not cover all possible information. If you have questions about this medicine, talk to your doctor, pharmacist, or health care provider. ?? 2023 Elsevier/Gold Standard (2020-10-14 00:00:00) ? ?

## 2022-02-28 ENCOUNTER — Ambulatory Visit (INDEPENDENT_AMBULATORY_CARE_PROVIDER_SITE_OTHER): Payer: BC Managed Care – PPO | Admitting: Obstetrics and Gynecology

## 2022-02-28 DIAGNOSIS — Z3042 Encounter for surveillance of injectable contraceptive: Secondary | ICD-10-CM

## 2022-03-03 NOTE — Telephone Encounter (Signed)
Could you please reschedule her next depo before October 6 and send her a mychart message letting her know her new appointment.

## 2022-03-04 ENCOUNTER — Other Ambulatory Visit (INDEPENDENT_AMBULATORY_CARE_PROVIDER_SITE_OTHER): Payer: BC Managed Care – PPO

## 2022-03-04 ENCOUNTER — Ambulatory Visit: Payer: BC Managed Care – PPO | Admitting: Family

## 2022-03-04 DIAGNOSIS — E669 Obesity, unspecified: Secondary | ICD-10-CM

## 2022-03-04 DIAGNOSIS — E785 Hyperlipidemia, unspecified: Secondary | ICD-10-CM | POA: Diagnosis not present

## 2022-03-04 DIAGNOSIS — E559 Vitamin D deficiency, unspecified: Secondary | ICD-10-CM | POA: Diagnosis not present

## 2022-03-04 LAB — CBC WITH DIFFERENTIAL/PLATELET
Basophils Absolute: 0 10*3/uL (ref 0.0–0.1)
Basophils Relative: 0.5 % (ref 0.0–3.0)
Eosinophils Absolute: 0.1 10*3/uL (ref 0.0–0.7)
Eosinophils Relative: 2.3 % (ref 0.0–5.0)
HCT: 40.2 % (ref 36.0–46.0)
Hemoglobin: 13.6 g/dL (ref 12.0–15.0)
Lymphocytes Relative: 25.1 % (ref 12.0–46.0)
Lymphs Abs: 1.6 10*3/uL (ref 0.7–4.0)
MCHC: 33.9 g/dL (ref 30.0–36.0)
MCV: 91.3 fl (ref 78.0–100.0)
Monocytes Absolute: 0.4 10*3/uL (ref 0.1–1.0)
Monocytes Relative: 6.1 % (ref 3.0–12.0)
Neutro Abs: 4.1 10*3/uL (ref 1.4–7.7)
Neutrophils Relative %: 66 % (ref 43.0–77.0)
Platelets: 196 10*3/uL (ref 150.0–400.0)
RBC: 4.41 Mil/uL (ref 3.87–5.11)
RDW: 13.7 % (ref 11.5–15.5)
WBC: 6.2 10*3/uL (ref 4.0–10.5)

## 2022-03-04 LAB — COMPREHENSIVE METABOLIC PANEL
ALT: 16 U/L (ref 0–35)
AST: 18 U/L (ref 0–37)
Albumin: 4.2 g/dL (ref 3.5–5.2)
Alkaline Phosphatase: 73 U/L (ref 39–117)
BUN: 15 mg/dL (ref 6–23)
CO2: 25 mEq/L (ref 19–32)
Calcium: 9.5 mg/dL (ref 8.4–10.5)
Chloride: 106 mEq/L (ref 96–112)
Creatinine, Ser: 0.84 mg/dL (ref 0.40–1.20)
GFR: 86.3 mL/min (ref >60.00)
Glucose, Bld: 87 mg/dL (ref 70–99)
Potassium: 4.4 mEq/L (ref 3.5–5.1)
Sodium: 138 mEq/L (ref 135–145)
Total Bilirubin: 0.3 mg/dL (ref 0.2–1.2)
Total Protein: 7.2 g/dL (ref 6.0–8.3)

## 2022-03-04 LAB — LIPID PANEL
Cholesterol: 188 mg/dL (ref 0–200)
HDL: 53.6 mg/dL (ref >39.00)
LDL Cholesterol: 120 mg/dL — ABNORMAL HIGH (ref 0–99)
NonHDL: 134.33
Total CHOL/HDL Ratio: 4
Triglycerides: 70 mg/dL (ref 0.0–149.0)
VLDL: 14 mg/dL (ref 0.0–40.0)

## 2022-03-04 LAB — VITAMIN D 25 HYDROXY (VIT D DEFICIENCY, FRACTURES): VITD: 30.37 ng/mL (ref 30.00–100.00)

## 2022-03-14 ENCOUNTER — Encounter: Payer: Self-pay | Admitting: Family

## 2022-03-14 ENCOUNTER — Ambulatory Visit: Payer: BC Managed Care – PPO | Admitting: Family

## 2022-03-14 VITALS — BP 124/68 | HR 93 | Temp 98.7°F | Resp 16 | Ht 65.0 in | Wt 239.1 lb

## 2022-03-14 DIAGNOSIS — E785 Hyperlipidemia, unspecified: Secondary | ICD-10-CM | POA: Diagnosis not present

## 2022-03-14 DIAGNOSIS — E559 Vitamin D deficiency, unspecified: Secondary | ICD-10-CM

## 2022-03-14 DIAGNOSIS — M542 Cervicalgia: Secondary | ICD-10-CM

## 2022-03-14 DIAGNOSIS — R635 Abnormal weight gain: Secondary | ICD-10-CM | POA: Insufficient documentation

## 2022-03-14 DIAGNOSIS — M62838 Other muscle spasm: Secondary | ICD-10-CM

## 2022-03-14 DIAGNOSIS — R61 Generalized hyperhidrosis: Secondary | ICD-10-CM

## 2022-03-14 MED ORDER — DRYSOL 20 % EX SOLN: Freq: Every day | CUTANEOUS | 0 refills | Status: DC

## 2022-03-14 NOTE — Assessment & Plan Note (Signed)
rx drysol  Ordering tsh pending results

## 2022-03-14 NOTE — Assessment & Plan Note (Signed)
Work on [employer] and exercise as tolerated

## 2022-03-14 NOTE — Progress Notes (Signed)
Established Patient Office Visit  Subjective:  Patient ID: Elizabeth Bridges, female    DOB: 1981/02/26  Age: 41 y.o. MRN: [id number]  CC:  Chief Complaint  Patient presents with   vitamin D    Follow up    HPI Elizabeth Bridges is here today for follow up.   Muscle spasm neck, started flexeril and naproxen which helped her. When it gets aggravates she applies some heat to the area with some relief and will take muscle relaxer prn.   Xray of neck showed minimal degenerative disc disease c5-6   Pt is concerned because she sweats excessively. She will barely exert effort and be sweaty everywhere. She is gaining some weight, three pounds since one month ago.  Wt Readings from Last 3 Encounters:  03/14/22 239 lb 2 oz (108.5 kg)  02/14/22 236 lb 4 oz (107.2 kg)  03/21/21 224 lb (101.6 kg)   She does have regular periods, on depo.   Does have mammogram scheduled July 27th 2023  Past Medical History:  Diagnosis Date   Dysmenorrhea    History of trichomoniasis    Uterine fibroid     Past Surgical History:  Procedure Laterality Date   DILATION AND CURETTAGE OF UTERUS     x 2    Family History  Problem Relation Age of Onset   Asthma Mother    Hypertension Father    Heart disease Father    Diabetes Father     Social History   Socioeconomic History   Marital status: Significant Other    Spouse name: Not on file   Number of children: 2   Years of education: Not on file   Highest education level: Not on file  Occupational History    Employer: [employer]  Tobacco Use   Smoking status: Never   Smokeless tobacco: Never  Vaping Use   Vaping Use: Never used  Substance and Sexual Activity   Alcohol use: Yes    Comment: rarely   Drug use: No   Sexual activity: Yes    Partners: Male    Birth control/protection: Injection  Other Topics Concern   Not on file  Social History Narrative   One girl one boy    46, 9   Engaged    Social Determinants of Adult nurse Strain: Not on file  Food Insecurity: Not on file  Transportation Needs: Not on file  Physical Activity: Not on file  Stress: Not on file  Social Connections: Not on file  Intimate Partner Violence: Not on file    Outpatient Medications Prior to Visit  Medication Sig Dispense Refill   cyclobenzaprine (FLEXERIL) 10 MG tablet Take 1 tablet (10 mg total) by mouth 3 (three) times daily as needed for muscle spasms. 30 tablet 0   medroxyPROGESTERone (DEPO-PROVERA) 150 MG/ML injection Inject 1 mL (150 mg total) into the muscle every 3 (three) months. 1 mL 0   Multiple Vitamin (MULTIVITAMIN) capsule Take 1 capsule by mouth daily.     naproxen (NAPROSYN) 500 MG tablet Take one po twice daily prn muscle pain 30 tablet 0   tretinoin (RETIN-A) 0.025 % cream Apply topically.     No facility-administered medications prior to visit.    No Known Allergies      Objective:    Physical Exam  Gen: NAD, resting comfortably HEENT: TMs normal bilaterally. OP clear. No thyromegaly noted.  CV: RRR with no murmurs appreciated Pulm: NWOB, CTAB with no crackles,  wheezes, or rhonchi GI: Normal bowel sounds present. Soft, Nontender, Nondistended. MSK: no edema, cyanosis, or clubbing noted Skin: warm, dry Psych: Normal affect and thought content  BP 124/68   Pulse 93   Temp 98.7 F (37.1 C)   Resp 16   Ht $R'5\' 5"'Hr$  (1.651 m)   Wt 239 lb 2 oz (108.5 kg)   SpO2 98%   BMI 39.79 kg/m  Wt Readings from Last 3 Encounters:  03/14/22 239 lb 2 oz (108.5 kg)  02/14/22 236 lb 4 oz (107.2 kg)  03/21/21 224 lb (101.6 kg)     Health Maintenance Due  Topic Date Due   Hepatitis C Screening  Never done   COVID-19 Vaccine (3 - Moderna series) 02/22/2020   PAP SMEAR-Modifier  02/14/2022    There are no preventive care reminders to display for this patient.  Lab Results  Component Value Date   TSH 1.810 02/09/2018   Lab Results  Component Value Date   WBC 6.2 03/04/2022   HGB  13.6 03/04/2022   HCT 40.2 03/04/2022   MCV 91.3 03/04/2022   PLT 196.0 03/04/2022   Lab Results  Component Value Date   NA 138 03/04/2022   K 4.4 03/04/2022   CO2 25 03/04/2022   GLUCOSE 87 03/04/2022   BUN 15 03/04/2022   CREATININE 0.84 03/04/2022   BILITOT 0.3 03/04/2022   ALKPHOS 73 03/04/2022   AST 18 03/04/2022   ALT 16 03/04/2022   PROT 7.2 03/04/2022   ALBUMIN 4.2 03/04/2022   CALCIUM 9.5 03/04/2022   ANIONGAP 9 10/29/2018   EGFR 90 03/21/2021   GFR 86.30 03/04/2022   Lab Results  Component Value Date   CHOL 188 03/04/2022   Lab Results  Component Value Date   HDL 53.60 03/04/2022   Lab Results  Component Value Date   LDLCALC 120 (H) 03/04/2022   Lab Results  Component Value Date   TRIG 70.0 03/04/2022   Lab Results  Component Value Date   CHOLHDL 4 03/04/2022   Lab Results  Component Value Date   HGBA1C 5.5 03/21/2021      Assessment & Plan:   Problem List Items Addressed This Visit       Musculoskeletal and Integument   Hyperhidrosis - Primary    rx drysol  Ordering tsh pending results       Relevant Medications   aluminum chloride (DRYSOL) 20 % external solution   Other Relevant Orders   TSH   Follicle Stimulating Hormone     Other   Vitamin D deficiency    Continue vitamin d supplement      Cervicalgia    Work on neck exercises Heat to site       Muscle spasm    Muscle relaxer prn        Elevated lipids    Work on low chol diet and exercise as tolerated      Abnormal weight gain    Order tsh pending results       Relevant Orders   TSH    Meds ordered this encounter  Medications   aluminum chloride (DRYSOL) 20 % external solution    Sig: Apply topically at bedtime.    Dispense:  35 mL    Refill:  0    Order Specific Question:   Supervising Provider    Answer:   Diona Browner, AMY E [[id number]]    Follow-up: Return in about 6 months (around 09/14/2022) for regular follow up appt .  Eugenia Pancoast, FNP

## 2022-03-14 NOTE — Assessment & Plan Note (Signed)
Muscle relaxer prn

## 2022-03-14 NOTE — Assessment & Plan Note (Signed)
Work on neck exercises Heat to site

## 2022-03-14 NOTE — Assessment & Plan Note (Signed)
Continue vitamin d supplement 

## 2022-03-14 NOTE — Assessment & Plan Note (Signed)
Order tsh pending results  

## 2022-03-15 LAB — FOLLICLE STIMULATING HORMONE: FSH: 10.5 m[IU]/mL

## 2022-03-15 LAB — TSH: TSH: 1.4 mIU/L

## 2022-03-20 ENCOUNTER — Ambulatory Visit
Admission: RE | Admit: 2022-03-20 | Discharge: 2022-03-20 | Disposition: A | Discharge status: Home / Self Care | Payer: BC Managed Care – PPO | Source: Ambulatory Visit | Attending: Family | Admitting: Family

## 2022-03-20 DIAGNOSIS — Z1231 Encounter for screening mammogram for malignant neoplasm of breast: Secondary | ICD-10-CM | POA: Insufficient documentation

## 2022-03-21 ENCOUNTER — Encounter: Payer: BC Managed Care – PPO | Admitting: Obstetrics and Gynecology

## 2022-04-29 NOTE — Patient Instructions (Incomplete)
Preventive Care 41-41 Years Old, Female Preventive care refers to lifestyle choices and visits with your health care provider that can promote health and wellness. Preventive care visits are also called wellness exams. What can I expect for my preventive care visit? Counseling Your health care provider may ask you questions about your: Medical history, including: Past medical problems. Family medical history. Pregnancy history. Current health, including: Menstrual cycle. Method of birth control. Emotional well-being. Home life and relationship well-being. Sexual activity and sexual health. Lifestyle, including: Alcohol, nicotine or tobacco, and drug use. Access to firearms. Diet, exercise, and sleep habits. Work and work environment. Sunscreen use. Safety issues such as seatbelt and bike helmet use. Physical exam Your health care provider will check your: Height and weight. These may be used to calculate your BMI (body mass index). BMI is a measurement that tells if you are at a healthy weight. Waist circumference. This measures the distance around your waistline. This measurement also tells if you are at a healthy weight and may help predict your risk of certain diseases, such as type 2 diabetes and high blood pressure. Heart rate and blood pressure. Body temperature. Skin for abnormal spots. What immunizations do I need?  Vaccines are usually given at various ages, according to a schedule. Your health care provider will recommend vaccines for you based on your age, medical history, and lifestyle or other factors, such as travel or where you work. What tests do I need? Screening Your health care provider may recommend screening tests for certain conditions. This may include: Lipid and cholesterol levels. Diabetes screening. This is done by checking your blood sugar (glucose) after you have not eaten for a while (fasting). Pelvic exam and Pap test. Hepatitis B test. Hepatitis C  test. HIV (human immunodeficiency virus) test. STI (sexually transmitted infection) testing, if you are at risk. Lung cancer screening. Colorectal cancer screening. Mammogram. Talk with your health care provider about when you should start having regular mammograms. This may depend on whether you have a family history of breast cancer. BRCA-related cancer screening. This may be done if you have a family history of breast, ovarian, tubal, or peritoneal cancers. Bone density scan. This is done to screen for osteoporosis. Talk with your health care provider about your test results, treatment options, and if necessary, the need for more tests. Follow these instructions at home: Eating and drinking  Eat a diet that includes fresh fruits and vegetables, whole grains, lean protein, and low-fat dairy products. Take vitamin and mineral supplements as recommended by your health care provider. Do not drink alcohol if: Your health care provider tells you not to drink. You are pregnant, may be pregnant, or are planning to become pregnant. If you drink alcohol: Limit how much you have to 0-1 drink a day. Know how much alcohol is in your drink. In the [place] one drink equals one 12 oz bottle of beer (355 mL), one 5 oz glass of wine (148 mL), or one 1 oz glass of hard liquor (44 mL). Lifestyle Brush your teeth every morning and night with fluoride toothpaste. Floss one time each day. Exercise for at least 30 minutes 5 or more days each week. Do not use any products that contain nicotine or tobacco. These products include cigarettes, chewing tobacco, and vaping devices, such as e-cigarettes. If you need help quitting, ask your health care provider. Do not use drugs. If you are sexually active, practice safe sex. Use a condom or other form of protection to   prevent STIs. If you do not wish to become pregnant, use a form of birth control. If you plan to become pregnant, see your health care provider for a  prepregnancy visit. Take aspirin only as told by your health care provider. Make sure that you understand how much to take and what form to take. Work with your health care provider to find out whether it is safe and beneficial for you to take aspirin daily. Find healthy ways to manage stress, such as: Meditation, yoga, or listening to music. Journaling. Talking to a trusted person. Spending time with friends and family. Minimize exposure to UV radiation to reduce your risk of skin cancer. Safety Always wear your seat belt while driving or riding in a vehicle. Do not drive: If you have been drinking alcohol. Do not ride with someone who has been drinking. When you are tired or distracted. While texting. If you have been using any mind-altering substances or drugs. Wear a helmet and other protective equipment during sports activities. If you have firearms in your house, make sure you follow all gun safety procedures. Seek help if you have been physically or sexually abused. What's next? Visit your health care provider once a year for an annual wellness visit. Ask your health care provider how often you should have your eyes and teeth checked. Stay up to date on all vaccines. This information is not intended to replace advice given to you by your health care provider. Make sure you discuss any questions you have with your health care provider. Document Revised: 02/06/2021 Document Reviewed: 02/06/2021 Elsevier Patient Education  [place] Breast self-awareness means being familiar with how your breasts look and feel. It involves checking your breasts regularly and telling your health care provider about any changes. Practicing breast self-awareness helps to maintain breast health. Sometimes, changes are not harmful (are benign). Other times, a change in your breasts can be a sign of a serious medical problem. Being familiar with the look and feel of your  breasts can help you catch a breast problem while it is still small and can be treated. You should do breast self-exams even if you have breast implants. What you need: A mirror. A well-lit room. A pillow or other soft object. How to do a breast self-exam A breast self-exam is one way to learn what is normal for your breasts and whether your breasts are changing. To do a breast self-exam: Look for changes  Remove all the clothing above your waist. Stand in front of a mirror in a room with good lighting. Put your hands down at your sides. Compare your breasts in the mirror. Look for differences between them (asymmetry), such as: Differences in shape. Differences in size. Puckers, dips, and bumps in one breast and not the other. Look at each breast for changes in the skin, such as: Redness. Scaly areas. Skin thickening. Dimpling. Open sores (ulcers). Look for changes in your nipples, such as: Discharge. Bleeding. Dimpling. Redness. A nipple that looks pushed in (retracted), or that has changed position. Feel for changes Carefully feel your breasts for lumps and changes. It is best to do this self-exam while lying down. Follow these steps to feel each breast: Place a pillow under the shoulder of one side of your body. Place the arm of that side of your body behind your head. Feel the breast of that side of your body using the hand of the opposite arm. To do this: Start  in the nipple area and use the pads of your three middle fingers to make -inch (2 cm) overlapping circles. Use light, medium, and then firm pressure as you feel your breast, gently covering the entire breast area and armpit. Continue the overlapping circles, moving downward over the breast until you feel your ribs below your breast. Then, make circles with your fingers going upward until you reach your collarbone. Next, make circles by moving outward across your breast and into your armpit area. Squeeze the nipple.  Check for discharge and lumps. Repeat steps 1-7 to check your other breast. Sit or stand in the tub or shower. With soapy water on your skin, feel each breast the same way you did when you were lying down. Write down what you find Writing down what you find can help you remember what to discuss with your health care provider. Write down: What is normal for each breast. Any changes that you find in each breast. These include: The kind of changes you find. Any pain or tenderness. Size and location of any lumps. Where you are in your menstrual cycle, if you are still getting your menstrual period (menstruating). General tips If you are breastfeeding, the best time to examine your breasts is after a feeding or after using a breast pump. If you menstruate, the best time to examine your breasts is 5-7 days after your menstrual period. Breasts are generally lumpier during menstrual periods, and it may be more difficult to notice changes. With time and practice, you will become more familiar with the differences in your breasts and more comfortable with the exam. Contact a health care provider if: You see a change in the shape or size of your breasts or nipples. You see a change in the skin of your breast or nipples, such as a reddened or scaly area. You have unusual discharge from your nipples. You find a new lump or thick area. You have breast pain. You have any concerns about your breast health. Summary Breast self-awareness includes looking for physical changes in your breasts and feeling for any changes within your breasts. Breast self-awareness should be done in front of a mirror in a well-lit room. If you menstruate, the best time to examine your breasts is 5-7 days after your menstrual period. Tell your health care provider about any changes you notice in your breasts. Changes include changes in size, changes on the skin, pain or tenderness, or unusual fluid from your nipples. This  information is not intended to replace advice given to you by your health care provider. Make sure you discuss any questions you have with your health care provider. Document Revised: 07/02/2021 Document Reviewed: 06/13/2021 Elsevier Patient Education  [place].

## 2022-04-29 NOTE — Progress Notes (Unsigned)
GYNECOLOGY ANNUAL PHYSICAL EXAM PROGRESS NOTE  Subjective:    Elizabeth Bridges is a 41 y.o. [phone] female who presents for an annual exam. The patient has no complaints today. The patient is sexually active. The patient participates in regular exercise: yes. Has the patient ever been transfused or tattooed?: yes. The patient reports that there is not domestic violence in her life.    Menstrual History: Menarche age: 42 No LMP recorded. Patient has had an injection.    Gynecologic History:  Contraception: Depo-Provera injections History of STI's: Trichomoniasis, 2016 Last Pap: 01/26/2019. Results were: normal.  Denies h/o abnormal pap smears. Last mammogram: UTD. Results were: normal   OB History  Gravida Para Term Preterm AB Living  '4 2 2 '$ 0 2 2  SAB IAB Ectopic Multiple Live Births  2 0 0 0 2    # Outcome Date GA Lbr Len/2nd Weight Sex Delivery Anes PTL Lv  4 Term 2013   6 lb 4 oz (2.835 kg) M Vag-Spont     3 Term 2003   6 lb 12 oz (3.062 kg) F Vag-Spont   LIV  2 SAB           1 SAB             Past Medical History:  Diagnosis Date   Dysmenorrhea    History of trichomoniasis    Uterine fibroid     Past Surgical History:  Procedure Laterality Date   DILATION AND CURETTAGE OF UTERUS     x 2    Family History  Problem Relation Age of Onset   Asthma Mother    Hypertension Father    Heart disease Father    Diabetes Father     Social History   Socioeconomic History   Marital status: Significant Other    Spouse name: Not on file   Number of children: 2   Years of education: Not on file   Highest education level: Not on file  Occupational History    Employer: [employer]  Tobacco Use   Smoking status: Never   Smokeless tobacco: Never  Vaping Use   Vaping Use: Never used  Substance and Sexual Activity   Alcohol use: Yes    Comment: rarely   Drug use: No   Sexual activity: Yes    Partners: Male    Birth control/protection: Injection  Other  Topics Concern   Not on file  Social History Narrative   One girl one boy    23, 9   Engaged    Social Determinants of Radio broadcast assistant Strain: Not on file  Food Insecurity: Not on file  Transportation Needs: Not on file  Physical Activity: Not on file  Stress: Not on file  Social Connections: Not on file  Intimate Partner Violence: Not on file    Current Outpatient Medications on File Prior to Visit  Medication Sig Dispense Refill   aluminum chloride (DRYSOL) 20 % external solution Apply topically at bedtime. 35 mL 0   cyclobenzaprine (FLEXERIL) 10 MG tablet Take 1 tablet (10 mg total) by mouth 3 (three) times daily as needed for muscle spasms. 30 tablet 0   medroxyPROGESTERone (DEPO-PROVERA) 150 MG/ML injection Inject 1 mL (150 mg total) into the muscle every 3 (three) months. 1 mL 0   Multiple Vitamin (MULTIVITAMIN) capsule Take 1 capsule by mouth daily.     naproxen (NAPROSYN) 500 MG tablet Take one po twice daily prn muscle pain 30  tablet 0   tretinoin (RETIN-A) 0.025 % cream Apply topically.     No current facility-administered medications on file prior to visit.    No Known Allergies   Review of Systems Constitutional: negative for chills, fatigue, fevers and sweats Eyes: negative for irritation, redness and visual disturbance Ears, nose, mouth, throat, and face: negative for hearing loss, nasal congestion, snoring and tinnitus Respiratory: negative for asthma, cough, sputum Cardiovascular: negative for chest pain, dyspnea, exertional chest pressure/discomfort, irregular heart beat, palpitations and syncope Gastrointestinal: negative for abdominal pain, change in bowel habits, nausea and vomiting Genitourinary: negative for abnormal menstrual periods, genital lesions, sexual problems and vaginal discharge, dysuria and urinary incontinence Integument/breast: negative for breast lump, breast tenderness and nipple discharge Hematologic/lymphatic: negative for  bleeding and easy bruising Musculoskeletal:negative for back pain and muscle weakness Neurological: negative for dizziness, headaches, vertigo and weakness Endocrine: negative for diabetic symptoms including polydipsia, polyuria and skin dryness Allergic/Immunologic: negative for hay fever and urticaria      Objective:  There were no vitals taken for this visit. There is no height or weight on file to calculate BMI.    General Appearance:    Alert, cooperative, no distress, appears stated age  Head:    Normocephalic, without obvious abnormality, atraumatic  Eyes:    PERRL, conjunctiva/corneas clear, EOM's intact, both eyes  Ears:    Normal external ear canals, both ears  Nose:   Nares normal, septum midline, mucosa normal, no drainage or sinus tenderness  Throat:   Lips, mucosa, and tongue normal; teeth and gums normal  Neck:   Supple, symmetrical, trachea midline, no adenopathy; thyroid: no enlargement/tenderness/nodules; no carotid bruit or JVD  Back:     Symmetric, no curvature, ROM normal, no CVA tenderness  Lungs:     Clear to auscultation bilaterally, respirations unlabored  Chest Wall:    No tenderness or deformity   Heart:    Regular rate and rhythm, S1 and S2 normal, no murmur, rub or gallop  Breast Exam:    No tenderness, masses, or nipple abnormality  Abdomen:     Soft, non-tender, bowel sounds active all four quadrants, no masses, no organomegaly.    Genitalia:    Pelvic:external genitalia normal, vagina without lesions, discharge, or tenderness, rectovaginal septum  normal. Cervix normal in appearance, no cervical motion tenderness, no adnexal masses or tenderness.  Uterus normal size, shape, mobile, regular contours, nontender.  Rectal:    Normal external sphincter.  No hemorrhoids appreciated. Internal exam not done.   Extremities:   Extremities normal, atraumatic, no cyanosis or edema  Pulses:   2+ and symmetric all extremities  Skin:   Skin color, texture, turgor normal,  no rashes or lesions  Lymph nodes:   Cervical, supraclavicular, and axillary nodes normal  Neurologic:   CNII-XII intact, normal strength, sensation and reflexes throughout   .  Labs:  Lab Results  Component Value Date   WBC 6.2 03/04/2022   HGB 13.6 03/04/2022   HCT 40.2 03/04/2022   MCV 91.3 03/04/2022   PLT 196.0 03/04/2022    Lab Results  Component Value Date   CREATININE 0.84 03/04/2022   BUN 15 03/04/2022   NA 138 03/04/2022   K 4.4 03/04/2022   CL 106 03/04/2022   CO2 25 03/04/2022    Lab Results  Component Value Date   ALT 16 03/04/2022   AST 18 03/04/2022   ALKPHOS 73 03/04/2022   BILITOT 0.3 03/04/2022    Lab Results  Component  Value Date   TSH 1.40 03/14/2022     Assessment:   No diagnosis found.   Plan:  Blood tests: {blood tests:13147}. Breast self exam technique reviewed and patient encouraged to perform self-exam monthly. Contraception: Depo-Provera injections. Discussed healthy lifestyle modifications. Mammogram  UTD Pap smear ordered. COVID vaccination status: Follow up in 1 year for annual exam   Rubie Maid, MD [facility]

## 2022-04-30 ENCOUNTER — Ambulatory Visit (INDEPENDENT_AMBULATORY_CARE_PROVIDER_SITE_OTHER): Payer: BC Managed Care – PPO | Admitting: Obstetrics and Gynecology

## 2022-04-30 ENCOUNTER — Other Ambulatory Visit (HOSPITAL_COMMUNITY)
Admission: RE | Admit: 2022-04-30 | Discharge: 2022-04-30 | Disposition: A | Discharge status: Home / Self Care | Payer: BC Managed Care – PPO | Source: Ambulatory Visit | Attending: Obstetrics and Gynecology | Admitting: Obstetrics and Gynecology

## 2022-04-30 ENCOUNTER — Encounter: Payer: Self-pay | Admitting: Obstetrics and Gynecology

## 2022-04-30 VITALS — BP 129/89 | HR 80 | Resp 16 | Ht 65.0 in | Wt 238.1 lb

## 2022-04-30 DIAGNOSIS — Z124 Encounter for screening for malignant neoplasm of cervix: Secondary | ICD-10-CM | POA: Diagnosis not present

## 2022-04-30 DIAGNOSIS — Z01419 Encounter for gynecological examination (general) (routine) without abnormal findings: Secondary | ICD-10-CM | POA: Diagnosis not present

## 2022-04-30 DIAGNOSIS — E669 Obesity, unspecified: Secondary | ICD-10-CM

## 2022-04-30 DIAGNOSIS — E559 Vitamin D deficiency, unspecified: Secondary | ICD-10-CM | POA: Diagnosis not present

## 2022-04-30 DIAGNOSIS — D251 Intramural leiomyoma of uterus: Secondary | ICD-10-CM

## 2022-04-30 DIAGNOSIS — Z113 Encounter for screening for infections with a predominantly sexual mode of transmission: Secondary | ICD-10-CM | POA: Insufficient documentation

## 2022-04-30 DIAGNOSIS — Z3042 Encounter for surveillance of injectable contraceptive: Secondary | ICD-10-CM

## 2022-05-05 LAB — CYTOLOGY - PAP
Chlamydia: NEGATIVE
Comment: NEGATIVE
Comment: NEGATIVE
Comment: NEGATIVE
Comment: NORMAL
Diagnosis: NEGATIVE
High risk HPV: NEGATIVE
Neisseria Gonorrhea: NEGATIVE
Trichomonas: NEGATIVE

## 2022-05-19 ENCOUNTER — Telehealth: Payer: Self-pay | Admitting: Obstetrics and Gynecology

## 2022-05-19 ENCOUNTER — Other Ambulatory Visit: Payer: Self-pay

## 2022-05-19 DIAGNOSIS — Z3042 Encounter for surveillance of injectable contraceptive: Secondary | ICD-10-CM

## 2022-05-19 MED ORDER — MEDROXYPROGESTERONE ACETATE 150 MG/ML IM SUSP: 150.0000 mg | INTRAMUSCULAR | 0 refills | Status: AC

## 2022-05-19 NOTE — Telephone Encounter (Signed)
Pts needs her depo called in can we call this in

## 2022-05-19 NOTE — Telephone Encounter (Signed)
This pt has a depo appt and she call ed the pharmacy they stated that they dont have her script so she needs it called in.Marland KitchenMarland Kitchen

## 2022-05-19 NOTE — Telephone Encounter (Signed)
Sent patient medication to pharmacy. I will inform patient that she no longer has to go to pharmacy. We will provide her medication from here on out.

## 2022-05-26 ENCOUNTER — Ambulatory Visit (INDEPENDENT_AMBULATORY_CARE_PROVIDER_SITE_OTHER): Payer: BC Managed Care – PPO

## 2022-05-26 ENCOUNTER — Ambulatory Visit: Payer: BC Managed Care – PPO

## 2022-05-26 VITALS — Wt 244.0 lb

## 2022-05-26 DIAGNOSIS — Z3042 Encounter for surveillance of injectable contraceptive: Secondary | ICD-10-CM

## 2022-05-26 MED ORDER — MEDROXYPROGESTERONE ACETATE 150 MG/ML IM SUSP
150.0000 mg | Freq: Once | INTRAMUSCULAR | Status: AC
  Administered 2022-05-26: 150 mg via INTRAMUSCULAR

## 2022-05-26 NOTE — Progress Notes (Signed)
Date last pap:04/30/2022 Last Depo-Provera: 02/28/2022 Side Effects if any: None. Serum HCG indicated? N/A. Depo-Provera 150 mg IM given by: Otelia Limes, CMA Next appointment due: December 18- January 1

## 2022-06-02 ENCOUNTER — Ambulatory Visit: Payer: BC Managed Care – PPO

## 2022-08-15 ENCOUNTER — Other Ambulatory Visit: Payer: Self-pay | Admitting: Obstetrics and Gynecology

## 2022-08-15 DIAGNOSIS — Z3042 Encounter for surveillance of injectable contraceptive: Secondary | ICD-10-CM

## 2022-08-21 DIAGNOSIS — Z3042 Encounter for surveillance of injectable contraceptive: Secondary | ICD-10-CM | POA: Insufficient documentation

## 2022-08-21 NOTE — Progress Notes (Cosign Needed)
    NURSE VISIT NOTE  Subjective:    Patient ID: Elizabeth Bridges, female    DOB: Dec 12, 1980, 41 y.o.   MRN: [id number]  HPI  Patient is a 41 y.o. [id number] female who presents for depo provera injection.   Objective:    BP 131/88   Pulse 89   Ht '5\' 5"'$  (1.651 m)   Wt 243 lb (110.2 kg)   BMI 40.44 kg/m   Date last pap: 04/30/2022. Last Depo-Provera: 05/26/2022.. Side Effects if any: none. Serum HCG indicated? No . Depo-Provera 150 mg IM given by: Levert Feinstein, CMA. Site: Right Deltoid  Lab Review  '@THIS'$  VISIT ONLY@  Assessment:   1. Encounter for surveillance of injectable contraceptive      Plan:   Next appointment due between March 16 and 48.    Landis Gandy, [place]

## 2022-08-22 ENCOUNTER — Ambulatory Visit (INDEPENDENT_AMBULATORY_CARE_PROVIDER_SITE_OTHER): Payer: BC Managed Care – PPO

## 2022-08-22 VITALS — BP 131/88 | HR 89 | Ht 65.0 in | Wt 243.0 lb

## 2022-08-22 DIAGNOSIS — Z3042 Encounter for surveillance of injectable contraceptive: Secondary | ICD-10-CM

## 2022-08-22 MED ORDER — MEDROXYPROGESTERONE ACETATE 150 MG/ML IM SUSP
150.0000 mg | Freq: Once | INTRAMUSCULAR | Status: AC
  Administered 2022-08-22: 150 mg via INTRAMUSCULAR

## 2022-09-15 ENCOUNTER — Encounter: Payer: Self-pay | Admitting: Family

## 2022-09-15 ENCOUNTER — Ambulatory Visit: Payer: BC Managed Care – PPO | Admitting: Family

## 2022-09-15 VITALS — BP 136/96 | HR 90 | Temp 98.3°F | Ht 65.0 in | Wt 239.0 lb

## 2022-09-15 DIAGNOSIS — E559 Vitamin D deficiency, unspecified: Secondary | ICD-10-CM

## 2022-09-15 DIAGNOSIS — E669 Obesity, unspecified: Secondary | ICD-10-CM | POA: Diagnosis not present

## 2022-09-15 DIAGNOSIS — E785 Hyperlipidemia, unspecified: Secondary | ICD-10-CM

## 2022-09-15 DIAGNOSIS — R61 Generalized hyperhidrosis: Secondary | ICD-10-CM

## 2022-09-15 DIAGNOSIS — N912 Amenorrhea, unspecified: Secondary | ICD-10-CM

## 2022-09-15 DIAGNOSIS — R030 Elevated blood-pressure reading, without diagnosis of hypertension: Secondary | ICD-10-CM

## 2022-09-15 DIAGNOSIS — M542 Cervicalgia: Secondary | ICD-10-CM

## 2022-09-15 NOTE — Assessment & Plan Note (Signed)
Pt advised of the following:  Continue medication as prescribed. Monitor blood pressure periodically and/or when you feel symptomatic. Goal is <130/90 on average. Ensure that you have rested for 30 minutes prior to checking your blood pressure. Record your readings and bring them to your next visit if necessary.work on a low sodium diet.  Suspect this may be part of reason patient is sweating and doe.

## 2022-09-15 NOTE — Assessment & Plan Note (Signed)
Pt advised to work on diet and exercise as tolerated  

## 2022-09-15 NOTE — Assessment & Plan Note (Signed)
Ordered vitamin d pending results.   

## 2022-09-15 NOTE — Progress Notes (Signed)
Established Patient Office Visit  Subjective:  Patient ID: Elizabeth Bridges, female    DOB: 1981/02/03  Age: 42 y.o. MRN: [id number]  CC:  Chief Complaint  Patient presents with   Excessive Sweating    Concerns of head sweating.    HPI Elizabeth Bridges is here today for follow up.   Pt is with acute concerns.  Axillary sweating is fine, but she has noticed increased diaphoresis from her head. Curious if these are maybe hot flashes? At work she finds if she has to walk a decent distance she has to slow down and calm down because she will sweat , and needs to cool down. Started for the last few years. Drysol has helped with axillary sweating however.   She is on depo provera, has been on > 5 years. Sees gynecology and will get bone density next year per patient. Amennorhea being on her depo.    Past Medical History:  Diagnosis Date   Dysmenorrhea    History of trichomoniasis    Uterine fibroid     Past Surgical History:  Procedure Laterality Date   DILATION AND CURETTAGE OF UTERUS     x 2    Family History  Problem Relation Age of Onset   Asthma Mother    Hypertension Father    Heart disease Father    Diabetes Father     Social History   Socioeconomic History   Marital status: Significant Other    Spouse name: Not on file   Number of children: 2   Years of education: Not on file   Highest education level: Not on file  Occupational History    Employer: [employer]  Tobacco Use   Smoking status: Never   Smokeless tobacco: Never  Vaping Use   Vaping Use: Never used  Substance and Sexual Activity   Alcohol use: Yes    Comment: rarely   Drug use: No   Sexual activity: Yes    Partners: Male    Birth control/protection: Injection  Other Topics Concern   Not on file  Social History Narrative   One girl one boy    20, 9   Engaged    Social Determinants of Radio broadcast assistant Strain: Not on file  Food Insecurity: Not on file  Transportation  Needs: Not on file  Physical Activity: Not on file  Stress: Not on file  Social Connections: Not on file  Intimate Partner Violence: Not on file    Outpatient Medications Prior to Visit  Medication Sig Dispense Refill   aluminum chloride (DRYSOL) 20 % external solution Apply topically at bedtime. 35 mL 0   cyclobenzaprine (FLEXERIL) 10 MG tablet Take 1 tablet (10 mg total) by mouth 3 (three) times daily as needed for muscle spasms. 30 tablet 0   medroxyPROGESTERone (DEPO-PROVERA) 150 MG/ML injection Inject 1 mL (150 mg total) into the muscle every 3 (three) months. 1 mL 0   Multiple Vitamin (MULTIVITAMIN) capsule Take 1 capsule by mouth daily.     naproxen (NAPROSYN) 500 MG tablet Take one po twice daily prn muscle pain 30 tablet 0   tretinoin (RETIN-A) 0.025 % cream Apply topically.     No facility-administered medications prior to visit.    No Known Allergies    ROS: Pertinent symptoms negative unless otherwise noted in HPI      Objective:    Physical Exam Vitals reviewed.  Constitutional:      Appearance: Normal appearance.  Eyes:  General:        Right eye: No discharge.        Left eye: No discharge.     Conjunctiva/sclera: Conjunctivae normal.  Cardiovascular:     Rate and Rhythm: Normal rate.  Pulmonary:     Effort: Pulmonary effort is normal. No respiratory distress.  Musculoskeletal:        General: Normal range of motion.     Cervical back: Normal range of motion.  Neurological:     General: No focal deficit present.     Mental Status: She is alert and oriented to person, place, and time. Mental status is at baseline.  Psychiatric:        Mood and Affect: Mood normal.        Behavior: Behavior normal.        Thought Content: Thought content normal.        Judgment: Judgment normal.       BP (!) 136/96   Pulse 90   Temp 98.3 F (36.8 C) (Oral)   Ht '5\' 5"'$  (1.651 m)   Wt 239 lb (108.4 kg)   SpO2 99%   BMI 39.77 kg/m  Wt Readings from Last  3 Encounters:  09/15/22 239 lb (108.4 kg)  08/22/22 243 lb (110.2 kg)  05/26/22 244 lb (110.7 kg)     There are no preventive care reminders to display for this patient.  There are no preventive care reminders to display for this patient.  Lab Results  Component Value Date   TSH 1.40 03/14/2022   Lab Results  Component Value Date   WBC 6.2 03/04/2022   HGB 13.6 03/04/2022   HCT 40.2 03/04/2022   MCV 91.3 03/04/2022   PLT 196.0 03/04/2022   Lab Results  Component Value Date   NA 138 03/04/2022   K 4.4 03/04/2022   CO2 25 03/04/2022   GLUCOSE 87 03/04/2022   BUN 15 03/04/2022   CREATININE 0.84 03/04/2022   BILITOT 0.3 03/04/2022   ALKPHOS 73 03/04/2022   AST 18 03/04/2022   ALT 16 03/04/2022   PROT 7.2 03/04/2022   ALBUMIN 4.2 03/04/2022   CALCIUM 9.5 03/04/2022   ANIONGAP 9 10/29/2018   EGFR 90 03/21/2021   GFR 86.30 03/04/2022   Lab Results  Component Value Date   CHOL 188 03/04/2022   Lab Results  Component Value Date   HDL 53.60 03/04/2022   Lab Results  Component Value Date   LDLCALC 120 (H) 03/04/2022   Lab Results  Component Value Date   TRIG 70.0 03/04/2022   Lab Results  Component Value Date   CHOLHDL 4 03/04/2022   Lab Results  Component Value Date   HGBA1C 5.5 03/21/2021      Assessment & Plan:   Problem List Items Addressed This Visit       Musculoskeletal and Integument   Hyperhidrosis    Labwork today to r/o hormonal cause.  Pending results.  Continue drysol for axillary areas.        Relevant Orders   CBC   Basic metabolic panel   Prolactin   TSH   Follicle stimulating hormone     Other   Obesity (BMI 35.0-39.9 without comorbidity)    Pt advised to work on diet and exercise as tolerated       Relevant Orders   Hemoglobin A1c   Vitamin D deficiency - Primary    Ordered vitamin d pending results.        Relevant  Orders   VITAMIN D 25 Hydroxy (Vit-D Deficiency, Fractures)   Elevated lipids    Pt advised  to:  work on low cholesterol diet and exercise as tolerated       Cervicalgia   Elevated blood pressure reading without diagnosis of hypertension    Pt advised of the following:  Continue medication as prescribed. Monitor blood pressure periodically and/or when you feel symptomatic. Goal is <130/90 on average. Ensure that you have rested for 30 minutes prior to checking your blood pressure. Record your readings and bring them to your next visit if necessary.work on a low sodium diet.  Suspect this may be part of reason patient is sweating and doe.        Other Visit Diagnoses     Amenorrhea       Relevant Orders   CBC   Basic metabolic panel   Prolactin   TSH   Follicle stimulating hormone       No orders of the defined types were placed in this encounter.   Follow-up: Return in about 2 weeks (around 09/29/2022) for f/u blood pressure.    Eugenia Pancoast, FNP

## 2022-09-15 NOTE — Assessment & Plan Note (Signed)
Pt advised to:  work on low cholesterol diet and exercise as tolerated

## 2022-09-15 NOTE — Patient Instructions (Signed)
  Stop by the lab prior to leaving today. I will notify you of your results once received.   ------------------------------------  Start monitoring your blood pressure daily, around the same time of day, for the next 2-3 weeks.  Ensure that you have rested for 30 minutes prior to checking your blood pressure. Record your readings and bring them to your next visit.  ------------------------------------   Regards,   Eugenia Pancoast FNP-C

## 2022-09-15 NOTE — Assessment & Plan Note (Signed)
Labwork today to r/o hormonal cause.  Pending results.  Continue drysol for axillary areas.

## 2022-09-16 ENCOUNTER — Other Ambulatory Visit: Payer: Self-pay | Admitting: Family

## 2022-09-16 DIAGNOSIS — E559 Vitamin D deficiency, unspecified: Secondary | ICD-10-CM

## 2022-09-16 LAB — BASIC METABOLIC PANEL WITH GFR
BUN: 8 mg/dL (ref 6–23)
CO2: 26 meq/L (ref 19–32)
Calcium: 9.4 mg/dL (ref 8.4–10.5)
Chloride: 103 meq/L (ref 96–112)
Creatinine, Ser: 0.82 mg/dL (ref 0.40–1.20)
GFR: 88.49 mL/min
Glucose, Bld: 75 mg/dL (ref 70–99)
Potassium: 3.7 meq/L (ref 3.5–5.1)
Sodium: 139 meq/L (ref 135–145)

## 2022-09-16 LAB — CBC
HCT: 40.5 % (ref 36.0–46.0)
Hemoglobin: 13.5 g/dL (ref 12.0–15.0)
MCHC: 33.5 g/dL (ref 30.0–36.0)
MCV: 90.5 fl (ref 78.0–100.0)
Platelets: 240 10*3/uL (ref 150.0–400.0)
RBC: 4.47 Mil/uL (ref 3.87–5.11)
RDW: 13.7 % (ref 11.5–15.5)
WBC: 6.8 10*3/uL (ref 4.0–10.5)

## 2022-09-16 LAB — PROLACTIN: Prolactin: 4.8 ng/mL

## 2022-09-16 LAB — VITAMIN D 25 HYDROXY (VIT D DEFICIENCY, FRACTURES): VITD: 26.34 ng/mL — ABNORMAL LOW (ref 30.00–100.00)

## 2022-09-16 LAB — HEMOGLOBIN A1C: Hgb A1c MFr Bld: 5.7 % (ref 4.6–6.5)

## 2022-09-16 LAB — FOLLICLE STIMULATING HORMONE: FSH: 7.2 m[IU]/mL

## 2022-09-16 LAB — TSH: TSH: 2.47 u[IU]/mL (ref 0.35–5.50)

## 2022-09-16 MED ORDER — VITAMIN D (ERGOCALCIFEROL) 1.25 MG (50000 UNIT) PO CAPS: 50000.0000 [IU] | ORAL_CAPSULE | ORAL | 0 refills | Status: DC

## 2022-09-30 ENCOUNTER — Ambulatory Visit: Payer: BC Managed Care – PPO | Admitting: Family

## 2022-09-30 ENCOUNTER — Encounter: Payer: Self-pay | Admitting: Family

## 2022-09-30 VITALS — BP 128/82 | HR 82 | Temp 98.4°F | Ht 65.0 in | Wt 238.6 lb

## 2022-09-30 DIAGNOSIS — R030 Elevated blood-pressure reading, without diagnosis of hypertension: Secondary | ICD-10-CM

## 2022-09-30 DIAGNOSIS — F411 Generalized anxiety disorder: Secondary | ICD-10-CM

## 2022-09-30 MED ORDER — ESCITALOPRAM OXALATE 10 MG PO TABS: 10.0000 mg | ORAL_TABLET | Freq: Every day | ORAL | 2 refills | Status: DC

## 2022-09-30 NOTE — Assessment & Plan Note (Signed)
Improved with improvement in stress

## 2022-09-30 NOTE — Progress Notes (Signed)
Established Patient Office Visit  Subjective:      CC:  Chief Complaint  Patient presents with   Hypertension    HPI: Elizabeth Bridges is a 42 y.o. female presenting on 09/30/2022 for Hypertension .  Elevated BP without dx htn has noticed in the last five days blood pressure doing better, average 115/70 or so. Denies cp palp and or sob. Has had improvement with sweating and also doe. Not as much.   She does state recently had to bring her fiance to the ER and she was having sciatica and work was crazy, everything was hitting at once, and as since decreased amount of stress. Has talked to a therapist in the past, but outside of that has not had much therapy.      09/30/2022    8:41 AM 09/15/2022    2:26 PM  GAD 7 : Generalized Anxiety Score  Nervous, Anxious, on Edge 0 0  Control/stop worrying 0 0  Worry too much - different things 0 0  Trouble relaxing 0 0  Restless 0 0  Easily annoyed or irritable 0 0  Afraid - awful might happen 0 0  Total GAD 7 Score 0 0  Anxiety Difficulty Not difficult at all Not difficult at all       09/30/2022    8:41 AM 09/15/2022    2:25 PM 04/30/2022    8:16 AM  PHQ9 SCORE ONLY  PHQ-9 Total Score 0 0 0       Social history:  Relevant past medical, surgical, family and social history reviewed and updated as indicated. Interim medical history since our last visit reviewed.  Allergies and medications reviewed and updated.  DATA REVIEWED: CHART IN EPIC     ROS: Negative unless specifically indicated above in HPI.    Current Outpatient Medications:    aluminum chloride (DRYSOL) 20 % external solution, Apply topically at bedtime., Disp: 35 mL, Rfl: 0   cyclobenzaprine (FLEXERIL) 10 MG tablet, Take 1 tablet (10 mg total) by mouth 3 (three) times daily as needed for muscle spasms., Disp: 30 tablet, Rfl: 0   escitalopram (LEXAPRO) 10 MG tablet, Take 1 tablet (10 mg total) by mouth daily., Disp: 30 tablet, Rfl: 2   medroxyPROGESTERone  (DEPO-PROVERA) 150 MG/ML injection, Inject 1 mL (150 mg total) into the muscle every 3 (three) months., Disp: 1 mL, Rfl: 0   Multiple Vitamin (MULTIVITAMIN) capsule, Take 1 capsule by mouth daily., Disp: , Rfl:    naproxen (NAPROSYN) 500 MG tablet, Take one po twice daily prn muscle pain, Disp: 30 tablet, Rfl: 0   tretinoin (RETIN-A) 0.025 % cream, Apply topically., Disp: , Rfl:    Vitamin D, Ergocalciferol, (DRISDOL) 1.25 MG (50000 UNIT) CAPS capsule, Take 1 capsule (50,000 Units total) by mouth every 7 (seven) days., Disp: 8 capsule, Rfl: 0      Objective:    BP 128/82   Pulse 82   Temp 98.4 F (36.9 C) (Temporal)   Ht '5\' 5"'$  (1.651 m)   Wt 238 lb 9.6 oz (108.2 kg)   SpO2 99%   BMI 39.71 kg/m   Wt Readings from Last 3 Encounters:  09/30/22 238 lb 9.6 oz (108.2 kg)  09/15/22 239 lb (108.4 kg)  08/22/22 243 lb (110.2 kg)    Physical Exam  Physical Exam Constitutional:      General: not in acute distress.    Appearance: Normal appearance. normal weight. is not ill-appearing, toxic-appearing or diaphoretic.  Cardiovascular:  Rate and Rhythm: Normal rate.  Pulmonary:     Effort: Pulmonary effort is normal.  Musculoskeletal:        General: Normal range of motion.  Neurological:     General: No focal deficit present.     Mental Status: alert and oriented to person, place, and time. Mental status is at baseline.  Psychiatric:        Mood and Affect: Mood normal.        Behavior: Behavior normal.        Thought Content: Thought content normal.        Judgment: Judgment normal.        Assessment & Plan:  GAD (generalized anxiety disorder) Assessment & Plan:  I instructed pt to start lexapro 1/2 tablet once daily for 1 week and then increase to a full tablet once daily on week two as tolerated.  We discussed common side effects such as nausea, drowsiness and weight gain.  Also discussed rare but serious side effect of suicidal ideation.  She is instructed to discontinue  medication and go directly to ED if this occurs.  Pt verbalizes understanding.  Plan is to follow up in 30 days to evaluate progress.     Orders: -     Escitalopram Oxalate; Take 1 tablet (10 mg total) by mouth daily.  Dispense: 30 tablet; Refill: 2  Elevated blood pressure reading without diagnosis of hypertension Assessment & Plan: Improved with improvement in stress      Return in about 6 weeks (around 11/11/2022) for f/u anxiety.  Eugenia Pancoast, MSN, APRN, FNP-C [place]

## 2022-09-30 NOTE — Assessment & Plan Note (Signed)
I instructed pt to start lexapro 1/2 tablet once daily for 1 week and then increase to a full tablet once daily on week two as tolerated.  We discussed common side effects such as nausea, drowsiness and weight gain.  Also discussed rare but serious side effect of suicidal ideation.  She is instructed to discontinue medication and go directly to ED if this occurs.  Pt verbalizes understanding.  Plan is to follow up in 30 days to evaluate progress.

## 2022-09-30 NOTE — Patient Instructions (Signed)
 ------------------------------------    Start lexapro 10  for anxiety and depression. Take 1/2 tablet by mouth once daily for about one week, then increase to 1 full tablet thereafter.   Taking the medicine as directed and not missing any doses is one of the best things you can do to treat your anxiety/depression.  Here are some things to keep in mind:  Side effects (stomach upset, some increased anxiety) may happen before you notice a benefit.  These side effects typically go away over time. Changes to your dose of medicine or a change in medication all together is sometimes necessary Many people will notice an improvement within two weeks but the full effect of the medication can take up to 4-6 weeks Stopping the medication when you start feeling better often results in a return of symptoms. Most people need to be on medication at least 6-12 months If you start having thoughts of hurting yourself or others after starting this medicine, please call me immediately.    ------------------------------------   Regards,   Eugenia Pancoast FNP-C

## 2022-10-11 ENCOUNTER — Other Ambulatory Visit: Payer: Self-pay | Admitting: Family

## 2022-10-11 DIAGNOSIS — E559 Vitamin D deficiency, unspecified: Secondary | ICD-10-CM

## 2022-10-23 ENCOUNTER — Other Ambulatory Visit: Payer: Self-pay | Admitting: Family

## 2022-10-23 DIAGNOSIS — F411 Generalized anxiety disorder: Secondary | ICD-10-CM

## 2022-11-11 ENCOUNTER — Ambulatory Visit: Payer: BC Managed Care – PPO | Admitting: Family

## 2022-11-12 ENCOUNTER — Ambulatory Visit (INDEPENDENT_AMBULATORY_CARE_PROVIDER_SITE_OTHER): Payer: BC Managed Care – PPO

## 2022-11-12 VITALS — BP 124/80 | Ht 65.0 in | Wt 237.0 lb

## 2022-11-12 DIAGNOSIS — Z3042 Encounter for surveillance of injectable contraceptive: Secondary | ICD-10-CM

## 2022-11-12 MED ORDER — MEDROXYPROGESTERONE ACETATE 150 MG/ML IM SUSY
150.0000 mg | PREFILLED_SYRINGE | Freq: Once | INTRAMUSCULAR | Status: AC
  Administered 2022-11-12: 150 mg via INTRAMUSCULAR

## 2022-11-12 NOTE — Patient Instructions (Signed)

## 2022-11-12 NOTE — Progress Notes (Signed)
    NURSE VISIT NOTE  Subjective:    Patient ID: Elizabeth Bridges, female    DOB: 04-Oct-1980, 42 y.o.   MRN: [id number]  HPI  Patient is a 42 y.o. [id number] female who presents for depo provera injection.   Objective:    BP 124/80   Ht 5\' 5"  (1.651 m)   Wt 237 lb (107.5 kg)   BMI 39.44 kg/m   Last Annual: 04/30/22. Last pap: 04/30/22. Last Depo-Provera: 08/22/22. Side Effects if any: none. Serum HCG indicated? No . Depo-Provera 150 mg IM given by: Drenda Freeze, CMA. Site: Right Deltoid  Assessment:   1. Encounter for surveillance of injectable contraceptive      Plan:   Next appointment due between June 5 and June 19.    Drenda Freeze, CMA

## 2023-01-02 ENCOUNTER — Encounter: Payer: Self-pay | Admitting: Family

## 2023-01-02 ENCOUNTER — Ambulatory Visit (INDEPENDENT_AMBULATORY_CARE_PROVIDER_SITE_OTHER)
Admission: RE | Admit: 2023-01-02 | Discharge: 2023-01-02 | Disposition: A | Discharge status: Home / Self Care | Payer: BC Managed Care – PPO | Source: Ambulatory Visit | Attending: Family | Admitting: Family

## 2023-01-02 ENCOUNTER — Ambulatory Visit: Payer: BC Managed Care – PPO | Admitting: Family

## 2023-01-02 VITALS — BP 128/78 | HR 76 | Temp 97.9°F | Ht 65.0 in | Wt 238.8 lb

## 2023-01-02 DIAGNOSIS — R0789 Other chest pain: Secondary | ICD-10-CM | POA: Diagnosis not present

## 2023-01-02 DIAGNOSIS — R1012 Left upper quadrant pain: Secondary | ICD-10-CM | POA: Diagnosis not present

## 2023-01-02 DIAGNOSIS — M25512 Pain in left shoulder: Secondary | ICD-10-CM | POA: Insufficient documentation

## 2023-01-02 DIAGNOSIS — R0602 Shortness of breath: Secondary | ICD-10-CM

## 2023-01-02 DIAGNOSIS — T781XXA Other adverse food reactions, not elsewhere classified, initial encounter: Secondary | ICD-10-CM

## 2023-01-02 LAB — CBC
HCT: 41.3 % (ref 36.0–46.0)
Hemoglobin: 13.8 g/dL (ref 12.0–15.0)
MCHC: 33.5 g/dL (ref 30.0–36.0)
MCV: 90.7 fl (ref 78.0–100.0)
Platelets: 219 10*3/uL (ref 150.0–400.0)
RBC: 4.55 Mil/uL (ref 3.87–5.11)
RDW: 13.7 % (ref 11.5–15.5)
WBC: 6.4 10*3/uL (ref 4.0–10.5)

## 2023-01-02 LAB — COMPREHENSIVE METABOLIC PANEL
ALT: 19 U/L (ref 0–35)
AST: 17 U/L (ref 0–37)
Albumin: 4.1 g/dL (ref 3.5–5.2)
Alkaline Phosphatase: 74 U/L (ref 39–117)
BUN: 9 mg/dL (ref 6–23)
CO2: 27 mEq/L (ref 19–32)
Calcium: 9.5 mg/dL (ref 8.4–10.5)
Chloride: 103 mEq/L (ref 96–112)
Creatinine, Ser: 0.85 mg/dL (ref 0.40–1.20)
GFR: 84.58 mL/min (ref >60.00)
Glucose, Bld: 83 mg/dL (ref 70–99)
Potassium: 4.2 mEq/L (ref 3.5–5.1)
Sodium: 139 mEq/L (ref 135–145)
Total Bilirubin: 0.5 mg/dL (ref 0.2–1.2)
Total Protein: 7.2 g/dL (ref 6.0–8.3)

## 2023-01-02 LAB — D-DIMER, QUANTITATIVE: D-Dimer, Quant: 0.46 mcg/mL FEU (ref <0.50)

## 2023-01-02 LAB — AMYLASE: Amylase: 36 U/L (ref 27–131)

## 2023-01-02 LAB — LIPASE: Lipase: 17 U/L (ref 11.0–59.0)

## 2023-01-02 NOTE — Assessment & Plan Note (Signed)
Suspect arthritis  Xray shoulder today pending results.  Ice heat to site. Can consider muscle relaxer for associated neck spasm however will await results of chest xray and also labs

## 2023-01-02 NOTE — Patient Instructions (Signed)
Complete xray(s) prior to leaving today. I will notify you of your results once received.  I have ordered imaging for you at [facility] for a STAT ultrasound of the abdomen . This order has been sent over for you electronically.  Please call [phone] to schedule this appointment.  Stop by the lab prior to leaving today. I will notify you of your results once received.    Regards,   Mort Sawyers FNP-C

## 2023-01-02 NOTE — Assessment & Plan Note (Addendum)
Chest xray order pending results to r/o infectious process and or chest abn Did advise to monitor for red flag symptoms and seek more urgent care/call 911 if this occurs EKG in office, reassuring with NSR

## 2023-01-02 NOTE — Assessment & Plan Note (Signed)
R/o pancreatitis, enlarged spleen and or rebound cholecystitis  Amylase lipase cbc cmp ordered pending results Stat u/s abd ordered pending results.

## 2023-01-02 NOTE — Progress Notes (Signed)
Established Patient Office Visit  Subjective:      CC:  Chief Complaint  Patient presents with   Flank Pain    Left side pain   Tingling    On left side     HPI: MAZEY BALTON is a 42 y.o. female presenting on 01/02/2023 for Flank Pain (Left side pain) and Tingling (On left side ) . Last week, around the middle of the week, she started with left sided chest pain, chest heaviness, and left sided shoulder pain that radiated down the arm. Still ongoing. Intermittent. She denies sob , no doe more than usual   No cough. No nasal congestion and or sore throat or ear pain.  No fever.   No known h/o asthma.  No allergy symptoms The symptoms are staying the  same, not worsening.  No increased anxiety. No pedal edema.   She does state after eating she has some tenderness/mild pain luq abdominal pain. No epigastric pain.  No flatulence and or heart burn.  No nausea.        Social history:  Relevant past medical, surgical, family and social history reviewed and updated as indicated. Interim medical history since our last visit reviewed.  Allergies and medications reviewed and updated.  DATA REVIEWED: CHART IN EPIC     ROS: Negative unless specifically indicated above in HPI.    Current Outpatient Medications:    cyclobenzaprine (FLEXERIL) 10 MG tablet, Take 1 tablet (10 mg total) by mouth 3 (three) times daily as needed for muscle spasms., Disp: 30 tablet, Rfl: 0   escitalopram (LEXAPRO) 10 MG tablet, TAKE 1 TABLET BY MOUTH EVERY DAY, Disp: 90 tablet, Rfl: 0   medroxyPROGESTERone (DEPO-PROVERA) 150 MG/ML injection, Inject 1 mL (150 mg total) into the muscle every 3 (three) months., Disp: 1 mL, Rfl: 0   Multiple Vitamin (MULTIVITAMIN) capsule, Take 1 capsule by mouth daily., Disp: , Rfl:    naproxen (NAPROSYN) 500 MG tablet, Take one po twice daily prn muscle pain, Disp: 30 tablet, Rfl: 0   tretinoin (RETIN-A) 0.025 % cream, Apply topically., Disp: , Rfl:        Objective:    BP 128/78 (BP Location: Left Arm)   Pulse 76   Temp 97.9 F (36.6 C) (Temporal)   Ht 5\' 5"  (1.651 m)   Wt 238 lb 12.8 oz (108.3 kg)   SpO2 99%   BMI 39.74 kg/m   Wt Readings from Last 3 Encounters:  01/02/23 238 lb 12.8 oz (108.3 kg)  11/12/22 237 lb (107.5 kg)  09/30/22 238 lb 9.6 oz (108.2 kg)    Physical Exam Constitutional:      General: She is not in acute distress.    Appearance: Normal appearance. She is normal weight. She is not ill-appearing, toxic-appearing or diaphoretic.  HENT:     Head: Normocephalic.  Neck:     Comments: Posterior left sided neck spasm and tenderness on palpation  Cardiovascular:     Rate and Rhythm: Normal rate and regular rhythm.  Pulmonary:     Effort: Pulmonary effort is normal.     Breath sounds: Normal breath sounds.  Abdominal:     General: Bowel sounds are normal.     Tenderness: There is abdominal tenderness in the left upper quadrant. There is no guarding or rebound.  Musculoskeletal:        General: Normal range of motion.     Left shoulder: Normal range of motion.     Comments: Positive  drop can and apley and external rotation positive left shoulder . Tenderness slight at [facility] joint.   Neurological:     General: No focal deficit present.     Mental Status: She is alert and oriented to person, place, and time. Mental status is at baseline.  Psychiatric:        Mood and Affect: Mood normal.        Behavior: Behavior normal.        Thought Content: Thought content normal.        Judgment: Judgment normal.            Assessment & Plan:  Chest discomfort Assessment & Plan: Chest xray order pending results to r/o infectious process and or chest abn Did advise to monitor for red flag symptoms and seek more urgent care/call 911 if this occurs EKG in office, reassuring with NSR  Orders: -     EKG 12-Lead -     D-dimer, quantitative -     DG Chest 2 View; Future -     US Abdomen Complete; Future  LUQ  abdominal pain Assessment & Plan: R/o pancreatitis, enlarged spleen and or rebound cholecystitis  Amylase lipase cbc cmp ordered pending results Stat u/s abd ordered pending results.   Orders: -     CBC -     Comprehensive metabolic panel -     Amylase -     Lipase -     US Abdomen Complete; Future  Gastrointestinal food sensitivity -     US Abdomen Complete; Future  Acute pain of left shoulder Assessment & Plan: Suspect arthritis  Xray shoulder today pending results.  Ice heat to site. Can consider muscle relaxer for associated neck spasm however will await results of chest xray and also labs   Orders: -     DG Shoulder Left; Future  SOB (shortness of breath) -     D-dimer, quantitative -     DG Chest 2 View; Future -     US Abdomen Complete; Future     Return if symptoms worsen or fail to improve.  Mort Sawyers, MSN, APRN, FNP-C [facility]

## 2023-01-06 ENCOUNTER — Ambulatory Visit
Admission: RE | Admit: 2023-01-06 | Discharge: 2023-01-06 | Disposition: A | Discharge status: Home / Self Care | Payer: BC Managed Care – PPO | Source: Ambulatory Visit | Attending: Family | Admitting: Family

## 2023-01-06 DIAGNOSIS — R0602 Shortness of breath: Secondary | ICD-10-CM | POA: Diagnosis not present

## 2023-01-06 DIAGNOSIS — R1012 Left upper quadrant pain: Secondary | ICD-10-CM | POA: Diagnosis not present

## 2023-01-06 DIAGNOSIS — T781XXA Other adverse food reactions, not elsewhere classified, initial encounter: Secondary | ICD-10-CM | POA: Diagnosis not present

## 2023-01-06 DIAGNOSIS — R0789 Other chest pain: Secondary | ICD-10-CM | POA: Insufficient documentation

## 2023-01-06 DIAGNOSIS — R109 Unspecified abdominal pain: Secondary | ICD-10-CM | POA: Diagnosis not present

## 2023-02-04 ENCOUNTER — Ambulatory Visit (INDEPENDENT_AMBULATORY_CARE_PROVIDER_SITE_OTHER): Payer: BC Managed Care – PPO

## 2023-02-04 VITALS — BP 136/80 | Ht 65.0 in | Wt 239.0 lb

## 2023-02-04 DIAGNOSIS — Z3042 Encounter for surveillance of injectable contraceptive: Secondary | ICD-10-CM | POA: Diagnosis not present

## 2023-02-04 MED ORDER — MEDROXYPROGESTERONE ACETATE 150 MG/ML IM SUSY
150.0000 mg | PREFILLED_SYRINGE | Freq: Once | INTRAMUSCULAR | Status: AC
Start: 2023-02-04 — End: 2023-02-04
  Administered 2023-02-04: 150 mg via INTRAMUSCULAR

## 2023-02-04 NOTE — Progress Notes (Signed)
    NURSE VISIT NOTE  Subjective:    Elizabeth Bridges ID: Elizabeth Bridges, female    DOB: 01-05-1981, 42 y.o.   MRN: [id number]  HPI  Elizabeth Bridges is a 42 y.o. [name] female who presents for depo provera injection.   Objective:    BP 136/80   Ht 5\' 5"  (1.651 m)   Wt 239 lb (108.4 kg)   BMI 39.77 kg/m   Last Annual: 04/30/22. Last pap: 04/30/22. Last Depo-Provera: 11/12/22. Side Effects if any: none. Serum HCG indicated? No . Depo-Provera 150 mg IM given by: Donnetta Hail, CMA. Site: Right Deltoid   Assessment:   No diagnosis found.   Plan:   Next appointment due between Aug 28 and Sept 11.    Donnetta Hail, CMA

## 2023-02-04 NOTE — Patient Instructions (Signed)

## 2023-03-25 ENCOUNTER — Encounter (INDEPENDENT_AMBULATORY_CARE_PROVIDER_SITE_OTHER): Payer: Self-pay

## 2023-04-07 ENCOUNTER — Ambulatory Visit: Payer: BC Managed Care – PPO | Admitting: Family

## 2023-04-08 ENCOUNTER — Encounter: Payer: Self-pay | Admitting: Obstetrics and Gynecology

## 2023-04-08 ENCOUNTER — Ambulatory Visit (INDEPENDENT_AMBULATORY_CARE_PROVIDER_SITE_OTHER): Payer: BC Managed Care – PPO | Admitting: Obstetrics and Gynecology

## 2023-04-08 VITALS — BP 138/85 | HR 80 | Resp 16 | Ht 65.0 in | Wt 241.3 lb

## 2023-04-08 DIAGNOSIS — Z1231 Encounter for screening mammogram for malignant neoplasm of breast: Secondary | ICD-10-CM

## 2023-04-08 DIAGNOSIS — Z01419 Encounter for gynecological examination (general) (routine) without abnormal findings: Secondary | ICD-10-CM

## 2023-04-08 DIAGNOSIS — Z3042 Encounter for surveillance of injectable contraceptive: Secondary | ICD-10-CM

## 2023-04-08 DIAGNOSIS — E669 Obesity, unspecified: Secondary | ICD-10-CM

## 2023-04-08 DIAGNOSIS — Z113 Encounter for screening for infections with a predominantly sexual mode of transmission: Secondary | ICD-10-CM

## 2023-04-08 DIAGNOSIS — L80 Vitiligo: Secondary | ICD-10-CM

## 2023-04-08 NOTE — Progress Notes (Signed)
GYNECOLOGY ANNUAL PHYSICAL EXAM PROGRESS NOTE  Subjective:    Elizabeth Bridges is a 42 y.o. [phone] female who presents for an annual exam.  The patient is sexually active. The patient participates in regular exercise: yes. Has the patient ever been transfused or tattooed?: no. The patient reports that there is not domestic violence in her life.   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 Is also noting some itching and irritation at the area. Wonders if she needs to see a Armed forces operational officer.   Menstrual History: Menarche age: 80 No LMP recorded. Patient has had an injection.    Gynecologic History:  Contraception: Depo-Provera injections. Has used for ~ 4 years.  History of STI's: Trichomoniasis, 2016  Last Pap: 04/30/2022. Results were: normal. Denies h/o abnormal pap smears. Last mammogram: 03/20/2022. Results were: normal    OB History  Gravida Para Term Preterm AB Living  4 2 2  0 2 2  SAB IAB Ectopic Multiple Live Births  2 0 0 0 2    # Outcome Date GA Lbr Len/2nd Weight Sex Type Anes PTL Lv  4 Term 2013   6 lb 4 oz (2.835 kg) M Vag-Spont     3 Term 2003   6 lb 12 oz (3.062 kg) F Vag-Spont   LIV  2 SAB           1 SAB             Past Medical History:  Diagnosis Date   Dysmenorrhea    History of trichomoniasis    Uterine fibroid     Past Surgical History:  Procedure Laterality Date   DILATION AND CURETTAGE OF UTERUS     x 2    Family History  Problem Relation Age of Onset   Asthma Mother    Hypertension Father    Heart disease Father    Diabetes Father     Social History   Socioeconomic History   Marital status: Significant Other    Spouse name: Not on file   Number of children: 2   Years of education: Not on file   Highest education level: Associate degree: academic program  Occupational History    Employer: [employer]  Tobacco Use   Smoking status: Never    Smokeless tobacco: Never  Vaping Use   Vaping status: Never Used  Substance and Sexual Activity   Alcohol use: Yes    Comment: rarely   Drug use: No   Sexual activity: Yes    Partners: Male    Birth control/protection: Injection  Other Topics Concern   Not on file  Social History Narrative   One girl one boy    14, 9   Engaged    Social Determinants of Corporate investment banker Strain: Low Risk  (12/31/2022)   Overall Financial Resource Strain (CARDIA)    Difficulty of Paying Living Expenses: Not very hard  Food Insecurity: No Food Insecurity (12/31/2022)   Hunger Vital Sign    Worried About Running Out of Food in the Last Year: Never true    Ran Out of Food in the Last Year: Never true  Transportation Needs: No Transportation Needs (12/31/2022)   PRAPARE - Administrator, Civil Service (Medical): No    Lack of Transportation (Non-Medical): No  Physical Activity: Unknown (12/31/2022)   Exercise Vital Sign    Days of Exercise per Week:  2 days    Minutes of Exercise per Session: Not on file  Stress: No Stress Concern Present (12/31/2022)   [employer] of Occupational Health - Occupational Stress Questionnaire    Feeling of Stress : Only a little  Social Connections: Moderately Isolated (12/31/2022)   Social Connection and Isolation Panel [NHANES]    Frequency of Communication with Friends and Family: Twice a week    Frequency of Social Gatherings with Friends and Family: Never    Attends Religious Services: More than 4 times per year    Active Member of [employer] or Organizations: No    Attends Engineer, structural: Not on file    Marital Status: Living with partner  Intimate Partner Violence: Not on file    Current Outpatient Medications on File Prior to Visit  Medication Sig Dispense Refill   cyclobenzaprine (FLEXERIL) 10 MG tablet Take 1 tablet (10 mg total) by mouth 3 (three) times daily as needed for muscle spasms. 30 tablet 0   escitalopram (LEXAPRO)  10 MG tablet TAKE 1 TABLET BY MOUTH EVERY DAY 90 tablet 0   medroxyPROGESTERone (DEPO-PROVERA) 150 MG/ML injection Inject 1 mL (150 mg total) into the muscle every 3 (three) months. 1 mL 0   Multiple Vitamin (MULTIVITAMIN) capsule Take 1 capsule by mouth daily.     naproxen (NAPROSYN) 500 MG tablet Take one po twice daily prn muscle pain 30 tablet 0   No current facility-administered medications on file prior to visit.    No Known Allergies   Review of Systems Constitutional: negative for chills, fatigue, fevers and sweats Eyes: negative for irritation, redness and visual disturbance Ears, nose, mouth, throat, and face: negative for hearing loss, nasal congestion, snoring and tinnitus Respiratory: negative for asthma, cough, sputum Cardiovascular: negative for chest pain, dyspnea, exertional chest pressure/discomfort, irregular heart beat, palpitations and syncope Gastrointestinal: negative for abdominal pain, change in bowel habits, nausea and vomiting Genitourinary: negative for abnormal menstrual periods, genital lesions, sexual problems and vaginal discharge, dysuria and urinary incontinence Integument/breast: negative for breast lump, breast tenderness and nipple discharge Hematologic/lymphatic: negative for bleeding and easy bruising Musculoskeletal:negative for back pain and muscle weakness Neurological: negative for dizziness, headaches, vertigo and weakness Endocrine: negative for diabetic symptoms including polydipsia, polyuria and skin dryness Allergic/Immunologic: negative for hay fever and urticaria    Skin: positive for worsening of skin discoloration of vulva, with associated itching  Objective:  Blood pressure 138/85, pulse 80, resp. rate 16, height 5\' 5"  (1.651 m), weight 241 lb 4.8 oz (109.5 kg).  Body mass index is 40.15 kg/m.  General Appearance:    Alert, cooperative, no distress, appears stated age, morbid obesity  Head:    Normocephalic, without obvious  abnormality, atraumatic  Eyes:    PERRL, conjunctiva/corneas clear, EOM's intact, both eyes  Ears:    Normal external ear canals, both ears  Nose:   Nares normal, septum midline, mucosa normal, no drainage or sinus tenderness  Throat:   Lips, mucosa, and tongue normal; teeth and gums normal  Neck:   Supple, symmetrical, trachea midline, no adenopathy; thyroid: no enlargement/tenderness/nodules; no carotid bruit or JVD  Back:     Symmetric, no curvature, ROM normal, no CVA tenderness  Lungs:     Clear to auscultation bilaterally, respirations unlabored  Chest Wall:    No tenderness or deformity   Heart:    Regular rate and rhythm, S1 and S2 normal, no murmur, rub or gallop  Breast Exam:    No tenderness,  masses, or nipple abnormality  Abdomen:     Soft, non-tender, bowel sounds active all four quadrants, no masses, no organomegaly.    Genitalia:    Pelvic:external genitalia with moderate hypopigmentation of lower 1/3 of vulva (labia minora an majora, perineum) and rectal area extending to buttock.  Mild hyperkeratosis of the lower 1/3 of the vulva observed, with fissures at the base of the labia minora bilaterally.  Vagina without lesions, discharge, or tenderness, rectovaginal septum  normal. Cervix normal in appearance, no cervical motion tenderness, no adnexal masses or tenderness.  Uterus normal size, shape, mobile, regular contours, nontender.  Rectal:    Normal external sphincter.  No hemorrhoids appreciated. Internal exam not done.   Extremities:   Extremities normal, atraumatic, no cyanosis or edema  Pulses:   2+ and symmetric all extremities  Skin:   Skin color, texture, turgor normal, no rashes or lesions  Lymph nodes:   Cervical, supraclavicular, and axillary nodes normal  Neurologic:   CNII-XII intact, normal strength, sensation and reflexes throughout   .  Labs:  Lab Results  Component Value Date   WBC 6.4 01/02/2023   HGB 13.8 01/02/2023   HCT 41.3 01/02/2023   MCV 90.7  01/02/2023   PLT 219.0 01/02/2023    Lab Results  Component Value Date   CREATININE 0.85 01/02/2023   BUN 9 01/02/2023   NA 139 01/02/2023   K 4.2 01/02/2023   CL 103 01/02/2023   CO2 27 01/02/2023    Lab Results  Component Value Date   ALT 19 01/02/2023   AST 17 01/02/2023   ALKPHOS 74 01/02/2023   BILITOT 0.5 01/02/2023    Lab Results  Component Value Date   TSH 2.47 09/15/2022     Assessment:   1. Encounter for well woman exam with routine gynecological exam   2. Encounter for screening mammogram for malignant neoplasm of breast   3. Screen for STD (sexually transmitted disease)   4. Obesity (BMI 35.0-39.9 without comorbidity)   5. Vitiligo   6. Depo-Provera contraceptive status      Plan:  Blood tests: One time screens for Hep C, HIV done with patient's permission. Breast self exam technique reviewed and patient encouraged to perform self-exam monthly. Contraception: Depo-Provera injections. Advised on initiation of Vitamin D and Calcium for bone health if continuing Depo injections. Next injection due next month.  Discussed healthy lifestyle modifications. Mammogram ordered.  Pap smear  up tp date . Referral placed to Dermatology for vitiligo or other similar condition.  Follow up in 1 year for annual exam   Hildred Laser, MD  [facility]

## 2023-04-08 NOTE — Patient Instructions (Signed)
Preventive Care 40-42 Years Old, Female Preventive care refers to lifestyle choices and visits with your health care provider that can promote health and wellness. Preventive care visits are also called wellness exams. What can I expect for my preventive care visit? Counseling Your health care provider may ask you questions about your: Medical history, including: Past medical problems. Family medical history. Pregnancy history. Current health, including: Menstrual cycle. Method of birth control. Emotional well-being. Home life and relationship well-being. Sexual activity and sexual health. Lifestyle, including: Alcohol, nicotine or tobacco, and drug use. Access to firearms. Diet, exercise, and sleep habits. Work and work environment. Sunscreen use. Safety issues such as seatbelt and bike helmet use. Physical exam Your health care provider will check your: Height and weight. These may be used to calculate your BMI (body mass index). BMI is a measurement that tells if you are at a healthy weight. Waist circumference. This measures the distance around your waistline. This measurement also tells if you are at a healthy weight and may help predict your risk of certain diseases, such as type 2 diabetes and high blood pressure. Heart rate and blood pressure. Body temperature. Skin for abnormal spots. What immunizations do I need?  Vaccines are usually given at various ages, according to a schedule. Your health care provider will recommend vaccines for you based on your age, medical history, and lifestyle or other factors, such as travel or where you work. What tests do I need? Screening Your health care provider may recommend screening tests for certain conditions. This may include: Lipid and cholesterol levels. Diabetes screening. This is done by checking your blood sugar (glucose) after you have not eaten for a while (fasting). Pelvic exam and Pap test. Hepatitis B test. Hepatitis C  test. HIV (human immunodeficiency virus) test. STI (sexually transmitted infection) testing, if you are at risk. Lung cancer screening. Colorectal cancer screening. Mammogram. Talk with your health care provider about when you should start having regular mammograms. This may depend on whether you have a family history of breast cancer. BRCA-related cancer screening. This may be done if you have a family history of breast, ovarian, tubal, or peritoneal cancers. Bone density scan. This is done to screen for osteoporosis. Talk with your health care provider about your test results, treatment options, and if necessary, the need for more tests. Follow these instructions at home: Eating and drinking  Eat a diet that includes fresh fruits and vegetables, whole grains, lean protein, and low-fat dairy products. Take vitamin and mineral supplements as recommended by your health care provider. Do not drink alcohol if: Your health care provider tells you not to drink. You are pregnant, may be pregnant, or are planning to become pregnant. If you drink alcohol: Limit how much you have to 0-1 drink a day. Know how much alcohol is in your drink. In the [place] one drink equals one 12 oz bottle of beer (355 mL), one 5 oz glass of wine (148 mL), or one 1 oz glass of hard liquor (44 mL). Lifestyle Brush your teeth every morning and night with fluoride toothpaste. Floss one time each day. Exercise for at least 30 minutes 5 or more days each week. Do not use any products that contain nicotine or tobacco. These products include cigarettes, chewing tobacco, and vaping devices, such as e-cigarettes. If you need help quitting, ask your health care provider. Do not use drugs. If you are sexually active, practice safe sex. Use a condom or other form of protection to   prevent STIs. If you do not wish to become pregnant, use a form of birth control. If you plan to become pregnant, see your health care provider for a  prepregnancy visit. Take aspirin only as told by your health care provider. Make sure that you understand how much to take and what form to take. Work with your health care provider to find out whether it is safe and beneficial for you to take aspirin daily. Find healthy ways to manage stress, such as: Meditation, yoga, or listening to music. Journaling. Talking to a trusted person. Spending time with friends and family. Minimize exposure to UV radiation to reduce your risk of skin cancer. Safety Always wear your seat belt while driving or riding in a vehicle. Do not drive: If you have been drinking alcohol. Do not ride with someone who has been drinking. When you are tired or distracted. While texting. If you have been using any mind-altering substances or drugs. Wear a helmet and other protective equipment during sports activities. If you have firearms in your house, make sure you follow all gun safety procedures. Seek help if you have been physically or sexually abused. What's next? Visit your health care provider once a year for an annual wellness visit. Ask your health care provider how often you should have your eyes and teeth checked. Stay up to date on all vaccines. This information is not intended to replace advice given to you by your health care provider. Make sure you discuss any questions you have with your health care provider. Document Revised: 02/06/2021 Document Reviewed: 02/06/2021 Elsevier Patient Education  2024 Elsevier Inc. Breast Self-Awareness Breast self-awareness is knowing how your breasts look and feel. You need to: Check your breasts on a regular basis. Tell your doctor about any changes. Become familiar with the look and feel of your breasts. This can help you catch a breast problem while it is still small and can be treated. You should do breast self-exams even if you have breast implants. What you need: A mirror. A well-lit room. A pillow or other  soft object. How to do a breast self-exam Follow these steps to do a breast self-exam: Look for changes  Take off all the clothes above your waist. Stand in front of a mirror in a room with good lighting. Put your hands down at your sides. Compare your breasts in the mirror. Look for any difference between them, such as: A difference in shape. A difference in size. Wrinkles, dips, and bumps in one breast and not the other. Look at each breast for changes in the skin, such as: Redness. Scaly areas. Skin that has gotten thicker. Dimpling. Open sores (ulcers). Look for changes in your nipples, such as: Fluid coming out of a nipple. Fluid around a nipple. Bleeding. Dimpling. Redness. A nipple that looks pushed in (retracted), or that has changed position. Feel for changes Lie on your back. Feel each breast. To do this: Pick a breast to feel. Place a pillow under the shoulder closest to that breast. Put the arm closest to that breast behind your head. Feel the nipple area of that breast using the hand of your other arm. Feel the area with the pads of your three middle fingers by making small circles with your fingers. Use light, medium, and firm pressure. Continue the overlapping circles, moving downward over the breast. Keep making circles with your fingers. Stop when you feel your ribs. Start making circles with your fingers again, this time going   upward until you reach your collarbone. Then, make circles outward across your breast and into your armpit area. Squeeze your nipple. Check for discharge and lumps. Repeat these steps to check your other breast. Sit or stand in the tub or shower. With soapy water on your skin, feel each breast the same way you did when you were lying down. Write down what you find Writing down what you find can help you remember what to tell your doctor. Write down: What is normal for each breast. Any changes you find in each breast. These  include: The kind of changes you find. A tender or painful breast. Any lump you find. Write down its size and where it is. When you last had your monthly period (menstrual cycle). General tips If you are breastfeeding, the best time to check your breasts is after you feed your baby or after you use a breast pump. If you get monthly bleeding, the best time to check your breasts is 5-7 days after your monthly cycle ends. With time, you will become comfortable with the self-exam. You will also start to know if there are changes in your breasts. Contact a doctor if: You see a change in the shape or size of your breasts or nipples. You see a change in the skin of your breast or nipples, such as red or scaly skin. You have fluid coming from your nipples that is not normal. You find a new lump or thick area. You have breast pain. You have any concerns about your breast health. Summary Breast self-awareness includes looking for changes in your breasts and feeling for changes within your breasts. You should do breast self-awareness in front of a mirror in a well-lit room. If you get monthly periods (menstrual cycles), the best time to check your breasts is 5-7 days after your period ends. Tell your doctor about any changes you see in your breasts. Changes include changes in size, changes on the skin, painful or tender breasts, or fluid from your nipples that is not normal. This information is not intended to replace advice given to you by your health care provider. Make sure you discuss any questions you have with your health care provider. Document Revised: 01/16/2022 Document Reviewed: 06/13/2021 Elsevier Patient Education  2024 Elsevier Inc.  

## 2023-04-09 LAB — HIV ANTIBODY (ROUTINE TESTING W REFLEX): HIV Screen 4th Generation wRfx: NONREACTIVE

## 2023-04-09 LAB — HEPATITIS C ANTIBODY: Hep C Virus Ab: NONREACTIVE

## 2023-04-13 ENCOUNTER — Ambulatory Visit: Payer: BC Managed Care – PPO | Admitting: Family

## 2023-04-29 ENCOUNTER — Ambulatory Visit
Admission: RE | Admit: 2023-04-29 | Discharge: 2023-04-29 | Disposition: A | Discharge status: Home / Self Care | Payer: BC Managed Care – PPO | Source: Ambulatory Visit | Attending: Obstetrics and Gynecology | Admitting: Obstetrics and Gynecology

## 2023-04-29 ENCOUNTER — Ambulatory Visit (INDEPENDENT_AMBULATORY_CARE_PROVIDER_SITE_OTHER): Payer: BC Managed Care – PPO

## 2023-04-29 VITALS — BP 137/98 | HR 76 | Wt 241.9 lb

## 2023-04-29 DIAGNOSIS — Z3042 Encounter for surveillance of injectable contraceptive: Secondary | ICD-10-CM

## 2023-04-29 DIAGNOSIS — Z1231 Encounter for screening mammogram for malignant neoplasm of breast: Secondary | ICD-10-CM | POA: Diagnosis not present

## 2023-04-29 MED ORDER — MEDROXYPROGESTERONE ACETATE 150 MG/ML IM SUSY
150.0000 mg | PREFILLED_SYRINGE | Freq: Once | INTRAMUSCULAR | Status: AC
Start: 2023-04-29 — End: 2023-04-29
  Administered 2023-04-29: 150 mg via INTRAMUSCULAR

## 2023-04-29 NOTE — Progress Notes (Signed)
    NURSE VISIT NOTE  Subjective:    Patient ID: Elizabeth Bridges, female    DOB: [birth year], 42 y.o.   MRN: [id number]  HPI  Patient is a 42 y.o. [id number] female who presents for depo provera injection. Blood pressure was elevated she stated it was elevated at her last annual as well.   Objective:    BP (!) 132/91   Pulse 78   Wt 241 lb 14.4 oz (109.7 kg)   BMI 40.25 kg/m   Last Annual: 04/08/2023. Last pap: 04/30/2022. Last Depo-Provera: 02/04/2023. Side Effects if any: none. Serum HCG indicated? No . Depo-Provera 150 mg IM given by: Doristine Devoid, CMA. Site: Right Deltoid  Lab Review  @THIS  VISIT ONLY@  Assessment:   1. Encounter for surveillance of injectable contraceptive      Plan:   Next appointment due between 07/15/23 thru 07/29/23.    Burtis Junes, CMA

## 2023-07-27 ENCOUNTER — Ambulatory Visit: Payer: BC Managed Care – PPO

## 2023-07-27 VITALS — BP 123/88 | HR 86 | Ht 65.0 in | Wt 240.3 lb

## 2023-07-27 DIAGNOSIS — Z3042 Encounter for surveillance of injectable contraceptive: Secondary | ICD-10-CM | POA: Diagnosis not present

## 2023-07-27 MED ORDER — MEDROXYPROGESTERONE ACETATE 150 MG/ML IM SUSP
150.0000 mg | Freq: Once | INTRAMUSCULAR | Status: AC
Start: 2023-07-27 — End: 2023-07-27
  Administered 2023-07-27: 150 mg via INTRAMUSCULAR

## 2023-07-27 NOTE — Progress Notes (Signed)
    NURSE VISIT NOTE  Subjective:    Patient ID: Elizabeth Bridges, female    DOB: 01/05/1981, 42 y.o.   MRN: [id number]  HPI  Patient is a 42 y.o. [id number] female who presents for depo provera injection.   Objective:    BP (!) 140/87   Pulse 86   Ht 5\' 5"  (1.651 m)   Wt 240 lb 4.8 oz (109 kg)   BMI 39.99 kg/m   Last Annual: 04/08/23. Last pap: 04/30/22. Last Depo-Provera: 04/29/23. Side Effects if any: n/a. Serum HCG indicated? No . Depo-Provera 150 mg IM given by: Elizabeth Bridges, CMA. Site: Left Deltoid    Assessment:   1. Surveillance for Depo-Provera contraception      Plan:   Next appointment due between 10/12/23 and 10/26/23.    Elizabeth Bridges, CMA

## 2023-09-15 ENCOUNTER — Encounter: Payer: Self-pay | Admitting: Dermatology

## 2023-09-15 ENCOUNTER — Ambulatory Visit: Payer: BC Managed Care – PPO | Admitting: Dermatology

## 2023-09-15 ENCOUNTER — Other Ambulatory Visit: Payer: Self-pay | Admitting: Dermatology

## 2023-09-15 VITALS — BP 161/109

## 2023-09-15 DIAGNOSIS — L700 Acne vulgaris: Secondary | ICD-10-CM

## 2023-09-15 DIAGNOSIS — L80 Vitiligo: Secondary | ICD-10-CM

## 2023-09-15 DIAGNOSIS — L309 Dermatitis, unspecified: Secondary | ICD-10-CM | POA: Diagnosis not present

## 2023-09-15 MED ORDER — CLOBETASOL PROPIONATE 0.05 % EX OINT: 1.0000 | TOPICAL_OINTMENT | Freq: Two times a day (BID) | CUTANEOUS | 2 refills | Status: AC

## 2023-09-15 MED ORDER — TACROLIMUS 0.1 % EX OINT: TOPICAL_OINTMENT | Freq: Two times a day (BID) | CUTANEOUS | 2 refills | Status: DC

## 2023-09-15 MED ORDER — TRETINOIN 0.025 % EX CREA: TOPICAL_CREAM | Freq: Every day | CUTANEOUS | 3 refills | Status: AC

## 2023-09-15 NOTE — Patient Instructions (Addendum)
Dear Elizabeth Bridges,  Thank you for visiting [place] today. Your dedication to addressing and improving your health concerns is greatly appreciated. Below is a summary of our discussion and your tailored treatment plan:  Vitiligo Treatment:   Application: Start with Clobetasol ointment, applying it twice daily for the first two weeks. Then, switch to Tacrolimus ointment, also applied twice daily, for the following two weeks.   Cycle: Continue alternating between Clobetasol and Tacrolimus every two weeks for a total duration of four months.   Follow-Up: We have scheduled a follow-up appointment in four months to assess the progress of your treatment.  Acne Management:   Morning Routine: Use Benzoyl Peroxide wash each morning, followed by applying a moisturizer.   Night Routine: On Monday, Wednesday, and Friday nights, apply Tretinoin 0.025% Cream after washing your face with a gentle cleanser. Follow this with a moisturizer.   Evaluation: A follow-up appointment is set for four months from now to evaluate the effectiveness of your acne treatment.  Skin Care for Hand Dermatitis   Preventative Measures: Always wear gloves when handling detergents or cleaning products to protect your skin.   Moisturizing: Apply a moisturizer after washing your hands to keep them hydrated.   Treatment Application: Use Clobetasol ointment twice daily on the affected areas on the hypothenar eminence and wrists for two weeks.  Please be aware that the repigmentation process from vitiligo treatment can be slow, potentially taking several months to years, and results may vary.  Should you have any questions or require further clarification on your treatment plan, please do not hesitate to reach out to our office.  Warm regards,  Dr. Langston Reusing, Dermatology    Important Information  Due to recent changes in healthcare laws, you may see results of your pathology and/or laboratory studies on MyChart before the doctors have  had a chance to review them. We understand that in some cases there may be results that are confusing or concerning to you. Please understand that not all results are received at the same time and often the doctors may need to interpret multiple results in order to provide you with the best plan of care or course of treatment. Therefore, we ask that you please give [place] 2 business days to thoroughly review all your results before contacting the office for clarification. Should we see a critical lab result, you will be contacted sooner.   If You Need Anything After Your Visit  If you have any questions or concerns for your doctor, please call our main line at [phone] If no one answers, please leave a voicemail as directed and we will return your call as soon as possible. Messages left after 4 pm will be answered the following business day.   You may also send [place] a message via MyChart. We typically respond to MyChart messages within 1-2 business days.  For prescription refills, please ask your pharmacy to contact our office. Our fax number is [phone].  If you have an urgent issue when the clinic is closed that cannot wait until the next business day, you can page your doctor at the number below.    Please note that while we do our best to be available for urgent issues outside of office hours, we are not available 24/7.   If you have an urgent issue and are unable to reach [place], you may choose to seek medical care at your doctor's office, retail clinic, urgent care center, or emergency room.  If you have a  medical emergency, please immediately call 911 or go to the emergency department. In the event of inclement weather, please call our main line at [phone] for an update on the status of any delays or closures.  Dermatology Medication Tips: Please keep the boxes that topical medications come in in order to help keep track of the instructions about where and how to use these. Pharmacies  typically print the medication instructions only on the boxes and not directly on the medication tubes.   If your medication is too expensive, please contact our office at [phone] or send [place] a message through MyChart.   We are unable to tell what your co-pay for medications will be in advance as this is different depending on your insurance coverage. However, we may be able to find a substitute medication at lower cost or fill out paperwork to get insurance to cover a needed medication.   If a prior authorization is required to get your medication covered by your insurance company, please allow [place] 1-2 business days to complete this process.  Drug prices often vary depending on where the prescription is filled and some pharmacies may offer cheaper prices.  The website www.goodrx.com contains coupons for medications through different pharmacies. The prices here do not account for what the cost may be with help from insurance (it may be cheaper with your insurance), but the website can give you the price if you did not use any insurance.  - You can print the associated coupon and take it with your prescription to the pharmacy.  - You may also stop by our office during regular business hours and pick up a GoodRx coupon card.  - If you need your prescription sent electronically to a different pharmacy, notify our office through [facility] or by phone at [phone]

## 2023-09-15 NOTE — Progress Notes (Signed)
   New Patient Visit   Subjective  Elizabeth Bridges is a 43 y.o. female who presents for the following: Discoloration on vaginal area. Her GYN noticed it and referred it here.  She also has some acne of her face that she would like treated and some dry spots of her wrists that she uses lotion on but they just come right back.    The following portions of the chart were reviewed this encounter and updated as appropriate: medications, allergies, medical history  Review of Systems:  No other skin or systemic complaints except as noted in HPI or Assessment and Plan.  Objective  Well appearing patient in no apparent distress; mood and affect are within normal limits.   A focused examination was performed of the following areas: Genital area, face, hands  Relevant exam findings are noted in the Assessment and Plan.    Assessment & Plan   VITILIGO Exam: depigmented patches on vaginal area  Vitiligo is a chronic autoimmune condition which causes loss of skin pigment and is commonly seen on the face and may also involve areas of trauma like hands, elbows, knees, and ankles. There is no cure and it is difficult to treat.  Treatments include topical steroids and other topical anti-inflammatory ointments/creams and topical and oral Jak inhibitors.  Sometimes narrow band UV light therapy or Xtrac laser is helpful, both of which require twice weekly treatments for at least 3-6 months.  Antioxidant vitamins, such as Vitamins A,C,E,D, Folic Acid and B12 may be added to enhance treatment. Heliocare may also enhance treatment results.  Treatment Plan: Clobetasol ointment - Apply to affected areas twice daily for 2 weeks - alternate with Tacrolimus every 2 weeks as needed  HAND DERMATITIS  Assessment: Patient presents with dry, itchy patches on the hypothenar eminence and wrist bilaterally. History of washing dishes and cleaning without gloves suggests irritant contact dermatitis.  Plan:   Wear  gloves when exposed to detergents and cleaning products.   Apply moisturizer after hand washing.   Clobetasol ointment to be applied twice daily to affected areas for 2 weeks.      Hand Dermatitis is a chronic type of eczema that can come and go on the hands and fingers.  While there is no cure, the rash and symptoms can be managed with topical prescription medications, and for more severe cases, with systemic medications.  Recommend mild soap and routine use of moisturizing cream after handwashing.  Minimize soap/water exposure when possible.    ACNE VULGARIS Exam:  Assessment: 43 year old patient experiencing hormonal acne flares. History of acne in childhood, with clear skin until recently.        Treatment Plan:   Benzoyl peroxide wash in the morning, followed by moisturizer.   Tretinoin 0.025% cream to be applied three nights per week (Monday, Wednesday, Friday).   Gentle cleanser in the evening.   Moisturizer after tretinoin application.   Follow-up in 4 months.      No follow-ups on file.  I, Joanie Coddington, CMA, am acting as scribe for [employer], DO .   Documentation: I have reviewed the above documentation for accuracy and completeness, and I agree with the above.  Langston Reusing, DO

## 2023-09-22 ENCOUNTER — Telehealth: Payer: Self-pay

## 2023-09-22 NOTE — Telephone Encounter (Signed)
Approved today by [facility] 2017  Approved.  Authorization Expiration Date: 09/21/2024

## 2023-09-22 NOTE — Telephone Encounter (Signed)
PA initated CMM Key: [id number]

## 2023-10-19 ENCOUNTER — Ambulatory Visit (INDEPENDENT_AMBULATORY_CARE_PROVIDER_SITE_OTHER): Payer: BC Managed Care – PPO

## 2023-10-19 ENCOUNTER — Telehealth: Payer: Self-pay

## 2023-10-19 VITALS — BP 132/85 | HR 81 | Ht 65.0 in | Wt 250.0 lb

## 2023-10-19 DIAGNOSIS — N938 Other specified abnormal uterine and vaginal bleeding: Secondary | ICD-10-CM

## 2023-10-19 DIAGNOSIS — Z3042 Encounter for surveillance of injectable contraceptive: Secondary | ICD-10-CM

## 2023-10-19 DIAGNOSIS — D251 Intramural leiomyoma of uterus: Secondary | ICD-10-CM

## 2023-10-19 MED ORDER — MEDROXYPROGESTERONE ACETATE 150 MG/ML IM SUSY
150.0000 mg | PREFILLED_SYRINGE | Freq: Once | INTRAMUSCULAR | Status: AC
Start: 2023-10-19 — End: 2023-10-19
  Administered 2023-10-19: 150 mg via INTRAMUSCULAR

## 2023-10-19 NOTE — Telephone Encounter (Signed)
 Pt came in today for her depo injection. She wants to let Dr. Valentino Saxon know the last month and a half she has had on/off vaginal spotting. Mainly when wipes and she has had to wear a panty liner. Has had some cramping with it. She says it reminds her of what she had going on before she got on depo.

## 2023-10-19 NOTE — Patient Instructions (Signed)

## 2023-10-19 NOTE — Progress Notes (Signed)
    NURSE VISIT NOTE  Subjective:    Patient ID: Elizabeth Bridges, female    DOB: [birth year], 43 y.o.   MRN: [id number]  HPI  Patient is a 43 y.o. [name] female who presents for depo provera injection.   Objective:    BP 132/85   Pulse 81   Ht 5\' 5"  (1.651 m)   Wt 250 lb (113.4 kg)   BMI 41.60 kg/m   Last Annual: 04/08/23. Last pap: 04/30/2022. Last Depo-Provera: 07/27/23. Side Effects if any: none. Serum HCG indicated? No . Depo-Provera 150 mg IM given by: Donnetta Hail, CMA. Site: Right Deltoid   Assessment:   1. Encounter for surveillance of injectable contraceptive      Plan:   Next appointment due between May 12 and May 26.    Donnetta Hail, CMA

## 2023-10-20 NOTE — Telephone Encounter (Signed)
 Well, it's been a while since she's had an ultrasound to check on her fibroids. We can make sure that they haven't started to grow or new ones developed. Also, depending on her interval we can bring her in sooner for her Depo which may help.

## 2023-10-22 NOTE — Telephone Encounter (Signed)
 Ok. Order placed. Just needs to be scheduled.

## 2023-10-22 NOTE — Telephone Encounter (Signed)
 Called pt, no answer, left detailed msg order had been put in and someone should reach out to schedule appt.

## 2023-11-05 ENCOUNTER — Ambulatory Visit
Admission: RE | Admit: 2023-11-05 | Discharge: 2023-11-05 | Disposition: A | Discharge status: Home / Self Care | Source: Ambulatory Visit | Attending: Obstetrics and Gynecology | Admitting: Obstetrics and Gynecology

## 2023-11-05 DIAGNOSIS — D251 Intramural leiomyoma of uterus: Secondary | ICD-10-CM | POA: Diagnosis not present

## 2023-11-05 DIAGNOSIS — D259 Leiomyoma of uterus, unspecified: Secondary | ICD-10-CM | POA: Diagnosis not present

## 2023-11-05 DIAGNOSIS — N938 Other specified abnormal uterine and vaginal bleeding: Secondary | ICD-10-CM | POA: Insufficient documentation

## 2023-11-17 ENCOUNTER — Encounter: Payer: Self-pay | Admitting: Obstetrics and Gynecology

## 2024-01-11 ENCOUNTER — Ambulatory Visit: Payer: BC Managed Care – PPO

## 2024-01-11 VITALS — BP 142/79 | HR 88 | Ht 65.0 in | Wt 250.8 lb

## 2024-01-11 DIAGNOSIS — Z3042 Encounter for surveillance of injectable contraceptive: Secondary | ICD-10-CM

## 2024-01-11 MED ORDER — MEDROXYPROGESTERONE ACETATE 150 MG/ML IM SUSP
150.0000 mg | Freq: Once | INTRAMUSCULAR | Status: AC
Start: 2024-01-11 — End: 2024-01-11
  Administered 2024-01-11: 150 mg via INTRAMUSCULAR

## 2024-01-11 NOTE — Progress Notes (Signed)
    NURSE VISIT NOTE  Subjective:    Patient ID: Elizabeth Bridges, female    DOB: 06-14-1981, 43 y.o.   MRN: [id number]  HPI  Patient is a 43 y.o. [id number] female who presents for depo provera  injection.   Objective:    BP (!) 142/79   Pulse 88   Ht 5\' 5"  (1.651 m)   Wt 250 lb 12.8 oz (113.8 kg)   BMI 41.74 kg/m   Last Annual: 04/08/2023. Last pap: 04/30/2022. Last Depo-Provera : 10/19/23. Side Effects if any: none. Serum HCG indicated? No . Depo-Provera  150 mg IM given by: Liane Redman, RN. Site: Right Deltoid  Lab Review  @THIS  VISIT ONLY@  Assessment:   1. Encounter for surveillance of injectable contraceptive      Plan:   Return for annual exam and next depo injection between 03/28/2024 and 04/12/2023.  Advised patient may see any available provider as Dr. Denman Fischer is no longer at our practice.   Juanita Norlander, RN

## 2024-01-13 ENCOUNTER — Ambulatory Visit: Admitting: Dermatology

## 2024-01-13 ENCOUNTER — Encounter: Payer: Self-pay | Admitting: Dermatology

## 2024-01-13 VITALS — BP 143/99

## 2024-01-13 DIAGNOSIS — L80 Vitiligo: Secondary | ICD-10-CM | POA: Diagnosis not present

## 2024-01-13 DIAGNOSIS — L719 Rosacea, unspecified: Secondary | ICD-10-CM | POA: Diagnosis not present

## 2024-01-13 DIAGNOSIS — L709 Acne, unspecified: Secondary | ICD-10-CM | POA: Diagnosis not present

## 2024-01-13 DIAGNOSIS — L309 Dermatitis, unspecified: Secondary | ICD-10-CM

## 2024-01-13 MED ORDER — SULFACETAMIDE SODIUM-SULFUR 8-4 % EX SUSP: CUTANEOUS | 2 refills | Status: AC

## 2024-01-13 NOTE — Patient Instructions (Addendum)
 Date: Wed Jan 13 2024  Hello Ms. Janalyn Me,  Thank you for visiting today. Here is a summary of the key instructions:  - Face Care:   - Wash face with sodium sulfacetamide wash (prescription will be sent to [facility])   - Apply azelaic acid lotion from [employer] (available at [facility]) every morning and on nights without tretinoin    - Use tretinoin  5 nights a week (Monday, Wednesday, Friday, and two additional nights)   - Apply moisturizer after treatments   - Use Neutrogena Ultra Sheer Mineral Sunscreen daily  - Hand Care:   - Continue using clobetasol  as prescribed   - Alternate clobetasol  with tacrolimus  ointment  Vitiligo In Groin   - Apply clobetasol  ointment, alternating with tacrolimus  ointment  - Follow-up:   - Return for follow-up appointment in 4 months   - Message through MyChart if refills are needed before next appointment  Please reach out if you have any questions or concerns.  Warm regards,  Dr. Louana Roup, Dermatology           Important Information  Due to recent changes in healthcare laws, you may see results of your pathology and/or laboratory studies on MyChart before the doctors have had a chance to review them. We understand that in some cases there may be results that are confusing or concerning to you. Please understand that not all results are received at the same time and often the doctors may need to interpret multiple results in order to provide you with the best plan of care or course of treatment. Therefore, we ask that you please give us  2 business days to thoroughly review all your results before contacting the office for clarification. Should we see a critical lab result, you will be contacted sooner.   If You Need Anything After Your Visit  If you have any questions or concerns for your doctor, please call our main line at [phone] If no one answers, please leave a voicemail as directed and we will return your call as soon  as possible. Messages left after 4 pm will be answered the following business day.   You may also send us  a message via MyChart. We typically respond to MyChart messages within 1-2 business days.  For prescription refills, please ask your pharmacy to contact our office. Our fax number is [phone].  If you have an urgent issue when the clinic is closed that cannot wait until the next business day, you can page your doctor at the number below.    Please note that while we do our best to be available for urgent issues outside of office hours, we are not available 24/7.   If you have an urgent issue and are unable to reach us , you may choose to seek medical care at your doctor's office, retail clinic, urgent care center, or emergency room.  If you have a medical emergency, please immediately call 911 or go to the emergency department. In the event of inclement weather, please call our main line at [phone] for an update on the status of any delays or closures.  Dermatology Medication Tips: Please keep the boxes that topical medications come in in order to help keep track of the instructions about where and how to use these. Pharmacies typically print the medication instructions only on the boxes and not directly on the medication tubes.   If your medication is too expensive, please contact our office at [phone] or send us  a message through MyChart.  We are unable to tell what your co-pay for medications will be in advance as this is different depending on your insurance coverage. However, we may be able to find a substitute medication at lower cost or fill out paperwork to get insurance to cover a needed medication.   If a prior authorization is required to get your medication covered by your insurance company, please allow us  1-2 business days to complete this process.  Drug prices often vary depending on where the prescription is filled and some pharmacies may offer cheaper  prices.  The website www.goodrx.com contains coupons for medications through different pharmacies. The prices here do not account for what the cost may be with help from insurance (it may be cheaper with your insurance), but the website can give you the price if you did not use any insurance.  - You can print the associated coupon and take it with your prescription to the pharmacy.  - You may also stop by our office during regular business hours and pick up a GoodRx coupon card.  - If you need your prescription sent electronically to a different pharmacy, notify our office through [facility] or by phone at [phone]

## 2024-01-13 NOTE — Progress Notes (Signed)
 Follow-Up Visit   Subjective  Elizabeth Bridges is a 43 y.o. female who presents for the following: Vitiligo  Patient present today for follow up visit. Patient was last evaluated on 09/15/23. At this visit patient was prescribed:  Tacrolimus  - Apply to affected areas for 2 weeks while breaking from clobetasol . Clobetasol  - apply to affected areas BID for 2 weeks on, 2 weeks off. Alternating with Tacrolimus .  Tretinoin  0.025% - applying to the face on MWF  Patient reports sxs are better. Patient denies medication changes.  The following portions of the chart were reviewed this encounter and updated as appropriate: medications, allergies, medical history  Review of Systems:  No other skin or systemic complaints except as noted in HPI or Assessment and Plan.  Objective  Well appearing patient in no apparent distress; mood and affect are within normal limits. .   A focused examination was performed of the following areas: genitalia area, face & hands   Relevant exam findings are noted in the Assessment and Plan.                    Assessment & Plan   1. Acne and Rosacea - Assessment: Patient presents with acne-like lesions on the face, including some on the nose. Clinical evaluation suggests a combination of acne and rosacea. Current treatment with tretinoin  has shown some improvement, but the condition is not yet clear. The presence of papular lesions indicates a need for more aggressive management.  - Plan:    Prescribe sodium sulfacetamide wash, to be compounded and dispensed by [facility]    Recommend azelaic acid lotion from [employer] brand, to be applied every morning and on nights without tretinoin     Continue tretinoin , increasing frequency to 5 nights per week    New skincare regimen:     - Morning: Wash with sulfacetamide, apply azelaic acid, moisturizer, and mineral sunscreen     - Evening: Wash face, apply tretinoin  on 5 nights/week, azelaic acid on  other nights, followed by moisturizer    Recommend Neutrogena Ultra Sheer Mineral Sunscreen    Follow up in 4 months to assess progress    Consider oral doxycycline 50 mg if topical treatments are insufficient  2. Hand Dermatitis - Assessment: Patient reports recurrence of hand dermatitis a couple of weeks ago. Currently using clobetasol  with good effect, reducing itch to a 2/10. Tacrolimus  has been less effective for itch control. The trigger for the recent flare is unknown.  - Plan:    Continue clobetasol  as needed for flares    Maintain tacrolimus  use as previously prescribed    Consider Dupixent (dupilumab) as a future option if topical treatments become inadequate    Patient to contact office via MyChart for medication refills if needed before next appointment  3. Vitiligo - Assessment: Patient has vitiligo in the groin area. Current treatment includes alternating clobetasol  ointment with tacrolimus  ointment. Some color improvement has been noted, but full assessment of treatment efficacy requires 6-9 months due to the slow nature of repigmentation.  - Plan:    Continue alternating clobetasol  ointment with tacrolimus  ointment in the affected area    Reassess treatment efficacy at 6-9 months    Patient to contact office via MyChart for medication refills if needed before next appointment    Return in about 4 months (around 05/15/2024) for acne/rosacea, hand dermatitis, & vitiligo.   Documentation: I have reviewed the above documentation for accuracy and completeness, and I agree with  the above.  I, Shirron Louanne Roussel, CMA, am acting as scribe for [employer], DO.   Louana Roup, DO

## 2024-01-14 ENCOUNTER — Ambulatory Visit: Payer: BC Managed Care – PPO | Admitting: Dermatology

## 2024-03-28 ENCOUNTER — Ambulatory Visit

## 2024-03-28 VITALS — BP 152/94 | HR 80 | Wt 247.5 lb

## 2024-03-28 DIAGNOSIS — Z3042 Encounter for surveillance of injectable contraceptive: Secondary | ICD-10-CM | POA: Diagnosis not present

## 2024-03-28 MED ORDER — MEDROXYPROGESTERONE ACETATE 150 MG/ML IM SUSY
150.0000 mg | PREFILLED_SYRINGE | Freq: Once | INTRAMUSCULAR | Status: AC
Start: 2024-03-28 — End: 2024-03-28
  Administered 2024-03-28: 150 mg via INTRAMUSCULAR

## 2024-03-28 NOTE — Progress Notes (Addendum)
    NURSE VISIT NOTE  Subjective:    Patient ID: Elizabeth Bridges, female    DOB: 11/03/1980, 43 y.o.   MRN: [id number]  HPI  Patient is a 43 y.o. [id number] female who presents for depo provera  injection. Blood pressure is elevated patient states she had a few stressful days.   Objective:    BP (!) 152/94 (BP Location: Right Arm, Patient Position: Sitting, Cuff Size: Large)   Pulse 80   Wt 247 lb 8 oz (112.3 kg)   BMI 41.19 kg/m   Last Annual: 04/08/2023. Last pap: 04/30/2022. Last Depo-Provera : 01/11/2024. Side Effects if any: none. Serum HCG indicated? No . Depo-Provera  150 mg IM given by: Burnard Ro, CMA. Site: Right Deltoid    Assessment:   1. Encounter for surveillance of injectable contraceptive      Plan:   Next appointment due between 06/13/24 thru 06/27/2024    Burnard LITTIE Ro, CMA

## 2024-04-04 NOTE — Progress Notes (Unsigned)
 GYNECOLOGY ANNUAL PHYSICAL EXAM NOTE  Subjective:    Elizabeth Bridges is a 43 y.o. [phone] female who presents for an annual exam.  The patient {is/is not/has never been:13135} sexually active. The patient participates in regular exercise: {yes/no/not asked:9010}. Has the patient ever been transfused or tattooed?: {yes/no/not asked:9010}. The patient reports that there {is/is not:9024} domestic violence in her life. The patient has completed the Gardasil vaccine: {yes/no:311178}  The patient has the following complaints today: ***  Menstrual History: Menarche age: *** No LMP recorded. Patient has had an injection.     Gynecologic History:  Contraception: Depo-Provera  injections History of STI's: Trichomoniasis, 2016  Last Pap: 04/30/22. Results were: normal.   History of abnormal pap(s): no Last mammogram: 04/29/23. Results were: normal       OB History  Gravida Para Term Preterm AB Living  4 2 2  0 2 2  SAB IAB Ectopic Multiple Live Births  2 0 0 0 2    # Outcome Date GA Lbr Len/2nd Weight Sex Type Anes PTL Lv  4 Term 2013   6 lb 4 oz (2.835 kg) M Vag-Spont     3 Term 2003   6 lb 12 oz (3.062 kg) F Vag-Spont   LIV  2 SAB           1 SAB             Past Medical History:  Diagnosis Date   Dysmenorrhea    History of trichomoniasis    Uterine fibroid     Past Surgical History:  Procedure Laterality Date   DILATION AND CURETTAGE OF UTERUS     x 2    Family History  Problem Relation Age of Onset   Asthma Mother    Hypertension Father    Heart disease Father    Diabetes Father    Breast cancer Neg Hx     Social History   Socioeconomic History   Marital status: Significant Other    Spouse name: Not on file   Number of children: 2   Years of education: Not on file   Highest education level: Associate degree: academic program  Occupational History    Employer: [employer]  Tobacco Use   Smoking status: Never   Smokeless tobacco: Never  Vaping Use    Vaping status: Never Used  Substance and Sexual Activity   Alcohol use: Yes    Comment: rarely   Drug use: No   Sexual activity: Yes    Partners: Male    Birth control/protection: Injection  Other Topics Concern   Not on file  Social History Narrative   One girl one boy    28, 9   Engaged    Social Drivers of Corporate investment banker Strain: Low Risk  (12/31/2022)   Overall Financial Resource Strain (CARDIA)    Difficulty of Paying Living Expenses: Not very hard  Food Insecurity: No Food Insecurity (12/31/2022)   Hunger Vital Sign    Worried About Running Out of Food in the Last Year: Never true    Ran Out of Food in the Last Year: Never true  Transportation Needs: No Transportation Needs (12/31/2022)   PRAPARE - Administrator, Civil Service (Medical): No    Lack of Transportation (Non-Medical): No  Physical Activity: Unknown (12/31/2022)   Exercise Vital Sign    Days of Exercise per Week: 2 days    Minutes of Exercise per Session: Not on file  Stress: No Stress Concern Present (12/31/2022)   [employer] of Occupational Health - Occupational Stress Questionnaire    Feeling of Stress : Only a little  Social Connections: Moderately Isolated (12/31/2022)   Social Connection and Isolation Panel    Frequency of Communication with Friends and Family: Twice a week    Frequency of Social Gatherings with Friends and Family: Never    Attends Religious Services: More than 4 times per year    Active Member of [employer] or Organizations: No    Attends Engineer, structural: Not on file    Marital Status: Living with partner  Intimate Partner Violence: Not on file    Current Outpatient Medications on File Prior to Visit  Medication Sig Dispense Refill   clobetasol  ointment (TEMOVATE ) 0.05 % Apply 1 Application topically 2 (two) times daily. Apply to affected areas twice daily for 2 weeks - alternate with Tacrolimus  every 2 weeks 60 g 2   cyclobenzaprine  (FLEXERIL ) 10  MG tablet Take 1 tablet (10 mg total) by mouth 3 (three) times daily as needed for muscle spasms. 30 tablet 0   medroxyPROGESTERone  (DEPO-PROVERA ) 150 MG/ML injection Inject 1 mL (150 mg total) into the muscle every 3 (three) months. 1 mL 0   Multiple Vitamin (MULTIVITAMIN) capsule Take 1 capsule by mouth daily.     naproxen  (NAPROSYN ) 500 MG tablet Take one po twice daily prn muscle pain 30 tablet 0   Sulfacetamide  Sodium-Sulfur  (SULFACLEANSE 8/4) 8-4 % SUSP Use to wash face morning and night 177 mL 2   tacrolimus  (PROTOPIC ) 0.1 % ointment APPLY TO AFFECTED AREAS TWICE DAILY FOR 2 WEEKS - ALTERNATE WITH CLOBETASOL  EVERY 2 WEEKS 60 g 5   tretinoin  (RETIN-A ) 0.025 % cream Apply topically at bedtime. Apply pea size amount to face for acne 3 nights per week (Monday, Wednesday, Friday) 45 g 3   No current facility-administered medications on file prior to visit.    Not on File   Review of Systems Constitutional: negative for chills, fatigue, fevers and sweats Eyes: negative for irritation, redness and visual disturbance Ears, nose, mouth, throat, and face: negative for hearing loss, nasal congestion, snoring and tinnitus Respiratory: negative for asthma, cough, sputum Cardiovascular: negative for chest pain, dyspnea, exertional chest pressure/discomfort, irregular heart beat, palpitations and syncope Gastrointestinal: negative for abdominal pain, change in bowel habits, nausea and vomiting Genitourinary: negative for abnormal menstrual periods, genital lesions, sexual problems and vaginal discharge, dysuria and urinary incontinence Integument/breast: negative for breast lump, breast tenderness and nipple discharge Hematologic/lymphatic: negative for bleeding and easy bruising Musculoskeletal:negative for back pain and muscle weakness Neurological: negative for dizziness, headaches, vertigo and weakness Endocrine: negative for diabetic symptoms including polydipsia, polyuria and skin  dryness Allergic/Immunologic: negative for hay fever and urticaria      Objective:  There were no vitals taken for this visit. There is no height or weight on file to calculate BMI.    General Appearance:    Alert, cooperative, no distress, appears stated age  Head:    Normocephalic, without obvious abnormality, atraumatic  Eyes:    Conjunctiva/corneas clear, EOMs intact bilaterally  Ears:    Normal external ear canals bilaterally  Nose:   Nares normal  Throat:   Lips, mucosa, and tongue normal; teeth and gums normal  Neck:   Supple, symmetrical, trachea midline, no adenopathy; thyroid : no enlargement/tenderness/nodules; no carotid bruit or JVD  Back:     ROM normal, no CVA tenderness  Lungs:     Clear  to auscultation bilaterally, respirations unlabored  Chest Wall:    No tenderness or deformity   Heart:    Regular rate and rhythm, S1 and S2 normal, no appreciated murmur, rub or gallop  Breast Exam:    No tenderness, masses, or nipple abnormality; no skin retraction, dimpling or nipple discharge.  Abdomen:     Soft, non-tender, bowel sounds active all four quadrants, no masses, no organomegaly.    Genitalia:    Pelvic: external genitalia normal, vagina without lesions, discharge, or tenderness, rectovaginal septum  normal. Cervix normal in appearance, no cervical motion tenderness, no adnexal masses or tenderness.  Uterus normal size, shape, mobile, regular contours, nontender.  Rectal:    Normal external sphincter.  No hemorrhoids appreciated. Internal exam not done.   Extremities:   Extremities normal, atraumatic, no cyanosis or edema  Pulses:   2+ and symmetric all extremities  Skin:   Skin color, texture, turgor normal, no rashes or lesions  Lymph nodes:   Cervical, supraclavicular, and axillary nodes normal  Neurologic:   CNII-XII grossly intact, normal strength, sensation and reflexes throughout   .  Labs:  Lab Results  Component Value Date   WBC 6.4 01/02/2023   HGB 13.8  01/02/2023   HCT 41.3 01/02/2023   MCV 90.7 01/02/2023   PLT 219.0 01/02/2023    Lab Results  Component Value Date   CREATININE 0.85 01/02/2023   BUN 9 01/02/2023   NA 139 01/02/2023   K 4.2 01/02/2023   CL 103 01/02/2023   CO2 27 01/02/2023    Lab Results  Component Value Date   ALT 19 01/02/2023   AST 17 01/02/2023   ALKPHOS 74 01/02/2023   BILITOT 0.5 01/02/2023    Lab Results  Component Value Date   TSH 2.47 09/15/2022     Assessment:   No diagnosis found.   Plan:   Elizabeth Bridges is a 43 y.o. [phone] female here today for her annual exam, doing well.  Pap: due 05/01/27 Mammogram: ordered today due September 2025. Labs: ***A1C, CMP, HepC, Lipid panel, Vit D, TSH PHQ-2 = ***, discussed coping techniques; RTC if worsens or develops concern Contraception: Depo Vaccines: Gardasil complete Healthy lifestyle modifications discussed: multivitamin, diet, exercise, sunscreen. Emphasized importance of regular physical activity.  Folic Acid recommendation reviewed.  All questions answered to patient's satisfaction.   Follow up 1 yr for annual, sooner prn.    Estil Mangle, DO [facility]

## 2024-04-04 NOTE — Progress Notes (Unsigned)
 GYNECOLOGY ANNUAL PHYSICAL EXAM NOTE  Subjective:    QUEEN ABBETT is a 43 y.o. [phone] female who presents for an annual exam.  The patient {is/is not/has never been:13135} sexually active. The patient participates in regular exercise: {yes/no/not asked:9010}. Has the patient ever been transfused or tattooed?: {yes/no/not asked:9010}. The patient reports that there {is/is not:9024} domestic violence in her life. The patient has completed the Gardasil vaccine: {yes/no:311178}  The patient has the following complaints today: ***  Menstrual History: Menarche age: *** No LMP recorded. Patient has had an injection.     Gynecologic History:  Contraception: Depo-Provera  injections History of STI's: Trichomoniasis, 2016  Last Pap: 04/30/22. Results were: normal.   History of abnormal pap(s): no Last mammogram: 04/29/23. Results were: normal       OB History  Gravida Para Term Preterm AB Living  4 2 2  0 2 2  SAB IAB Ectopic Multiple Live Births  2 0 0 0 2    # Outcome Date GA Lbr Len/2nd Weight Sex Type Anes PTL Lv  4 Term 2013   6 lb 4 oz (2.835 kg) M Vag-Spont     3 Term 2003   6 lb 12 oz (3.062 kg) F Vag-Spont   LIV  2 SAB           1 SAB             Past Medical History:  Diagnosis Date   Dysmenorrhea    History of trichomoniasis    Uterine fibroid     Past Surgical History:  Procedure Laterality Date   DILATION AND CURETTAGE OF UTERUS     x 2    Family History  Problem Relation Age of Onset   Asthma Mother    Hypertension Father    Heart disease Father    Diabetes Father    Breast cancer Neg Hx     Social History   Socioeconomic History   Marital status: Significant Other    Spouse name: Not on file   Number of children: 2   Years of education: Not on file   Highest education level: Associate degree: academic program  Occupational History    Employer: [employer]  Tobacco Use   Smoking status: Never   Smokeless tobacco: Never  Vaping Use    Vaping status: Never Used  Substance and Sexual Activity   Alcohol use: Yes    Comment: rarely   Drug use: No   Sexual activity: Yes    Partners: Male    Birth control/protection: Injection  Other Topics Concern   Not on file  Social History Narrative   One girl one boy    68, 9   Engaged    Social Drivers of Corporate investment banker Strain: Low Risk  (12/31/2022)   Overall Financial Resource Strain (CARDIA)    Difficulty of Paying Living Expenses: Not very hard  Food Insecurity: No Food Insecurity (12/31/2022)   Hunger Vital Sign    Worried About Running Out of Food in the Last Year: Never true    Ran Out of Food in the Last Year: Never true  Transportation Needs: No Transportation Needs (12/31/2022)   PRAPARE - Administrator, Civil Service (Medical): No    Lack of Transportation (Non-Medical): No  Physical Activity: Unknown (12/31/2022)   Exercise Vital Sign    Days of Exercise per Week: 2 days    Minutes of Exercise per Session: Not on file  Stress: No Stress Concern Present (12/31/2022)   [employer] of Occupational Health - Occupational Stress Questionnaire    Feeling of Stress : Only a little  Social Connections: Moderately Isolated (12/31/2022)   Social Connection and Isolation Panel    Frequency of Communication with Friends and Family: Twice a week    Frequency of Social Gatherings with Friends and Family: Never    Attends Religious Services: More than 4 times per year    Active Member of [employer] or Organizations: No    Attends Engineer, structural: Not on file    Marital Status: Living with partner  Intimate Partner Violence: Not on file    Current Outpatient Medications on File Prior to Visit  Medication Sig Dispense Refill   clobetasol  ointment (TEMOVATE ) 0.05 % Apply 1 Application topically 2 (two) times daily. Apply to affected areas twice daily for 2 weeks - alternate with Tacrolimus  every 2 weeks 60 g 2   cyclobenzaprine  (FLEXERIL ) 10  MG tablet Take 1 tablet (10 mg total) by mouth 3 (three) times daily as needed for muscle spasms. 30 tablet 0   medroxyPROGESTERone  (DEPO-PROVERA ) 150 MG/ML injection Inject 1 mL (150 mg total) into the muscle every 3 (three) months. 1 mL 0   Multiple Vitamin (MULTIVITAMIN) capsule Take 1 capsule by mouth daily.     naproxen  (NAPROSYN ) 500 MG tablet Take one po twice daily prn muscle pain 30 tablet 0   Sulfacetamide  Sodium-Sulfur  (SULFACLEANSE 8/4) 8-4 % SUSP Use to wash face morning and night 177 mL 2   tacrolimus  (PROTOPIC ) 0.1 % ointment APPLY TO AFFECTED AREAS TWICE DAILY FOR 2 WEEKS - ALTERNATE WITH CLOBETASOL  EVERY 2 WEEKS 60 g 5   tretinoin  (RETIN-A ) 0.025 % cream Apply topically at bedtime. Apply pea size amount to face for acne 3 nights per week (Monday, Wednesday, Friday) 45 g 3   No current facility-administered medications on file prior to visit.    Not on File   Review of Systems Constitutional: negative for chills, fatigue, fevers and sweats Eyes: negative for irritation, redness and visual disturbance Ears, nose, mouth, throat, and face: negative for hearing loss, nasal congestion, snoring and tinnitus Respiratory: negative for asthma, cough, sputum Cardiovascular: negative for chest pain, dyspnea, exertional chest pressure/discomfort, irregular heart beat, palpitations and syncope Gastrointestinal: negative for abdominal pain, change in bowel habits, nausea and vomiting Genitourinary: negative for abnormal menstrual periods, genital lesions, sexual problems and vaginal discharge, dysuria and urinary incontinence Integument/breast: negative for breast lump, breast tenderness and nipple discharge Hematologic/lymphatic: negative for bleeding and easy bruising Musculoskeletal:negative for back pain and muscle weakness Neurological: negative for dizziness, headaches, vertigo and weakness Endocrine: negative for diabetic symptoms including polydipsia, polyuria and skin  dryness Allergic/Immunologic: negative for hay fever and urticaria      Objective:  There were no vitals taken for this visit. There is no height or weight on file to calculate BMI.    General Appearance:    Alert, cooperative, no distress, appears stated age  Head:    Normocephalic, without obvious abnormality, atraumatic  Eyes:    Conjunctiva/corneas clear, EOMs intact bilaterally  Ears:    Normal external ear canals bilaterally  Nose:   Nares normal  Throat:   Lips, mucosa, and tongue normal; teeth and gums normal  Neck:   Supple, symmetrical, trachea midline, no adenopathy; thyroid : no enlargement/tenderness/nodules; no carotid bruit or JVD  Back:     ROM normal, no CVA tenderness  Lungs:     Clear  to auscultation bilaterally, respirations unlabored  Chest Wall:    No tenderness or deformity   Heart:    Regular rate and rhythm, S1 and S2 normal, no appreciated murmur, rub or gallop  Breast Exam:    No tenderness, masses, or nipple abnormality; no skin retraction, dimpling or nipple discharge.  Abdomen:     Soft, non-tender, bowel sounds active all four quadrants, no masses, no organomegaly.    Genitalia:    Pelvic: external genitalia normal, vagina without lesions, discharge, or tenderness, rectovaginal septum  normal. Cervix normal in appearance, no cervical motion tenderness, no adnexal masses or tenderness.  Uterus normal size, shape, mobile, regular contours, nontender.  Rectal:    Normal external sphincter.  No hemorrhoids appreciated. Internal exam not done.   Extremities:   Extremities normal, atraumatic, no cyanosis or edema  Pulses:   2+ and symmetric all extremities  Skin:   Skin color, texture, turgor normal, no rashes or lesions  Lymph nodes:   Cervical, supraclavicular, and axillary nodes normal  Neurologic:   CNII-XII grossly intact, normal strength, sensation and reflexes throughout   .  Labs:  Lab Results  Component Value Date   WBC 6.4 01/02/2023   HGB 13.8  01/02/2023   HCT 41.3 01/02/2023   MCV 90.7 01/02/2023   PLT 219.0 01/02/2023    Lab Results  Component Value Date   CREATININE 0.85 01/02/2023   BUN 9 01/02/2023   NA 139 01/02/2023   K 4.2 01/02/2023   CL 103 01/02/2023   CO2 27 01/02/2023    Lab Results  Component Value Date   ALT 19 01/02/2023   AST 17 01/02/2023   ALKPHOS 74 01/02/2023   BILITOT 0.5 01/02/2023    Lab Results  Component Value Date   TSH 2.47 09/15/2022     Assessment:   No diagnosis found.   Plan:   YARITZEL STANGE is a 43 y.o. [phone] female here today for her annual exam, doing well.  Pap: due 05/01/27 Mammogram: ordered today due September 2025. Labs: ***A1C, CMP, HepC, Lipid panel, Vit D, TSH PHQ-2 = ***, discussed coping techniques; RTC if worsens or develops concern Contraception: Depo Vaccines: Gardasil complete Healthy lifestyle modifications discussed: multivitamin, diet, exercise, sunscreen. Emphasized importance of regular physical activity.  Folic Acid recommendation reviewed.  All questions answered to patient's satisfaction.   Follow up 1 yr for annual, sooner prn.    Estil Mangle, DO [facility]

## 2024-04-12 ENCOUNTER — Encounter: Payer: Self-pay | Admitting: Obstetrics

## 2024-04-12 ENCOUNTER — Ambulatory Visit (INDEPENDENT_AMBULATORY_CARE_PROVIDER_SITE_OTHER): Admitting: Obstetrics

## 2024-04-12 VITALS — BP 140/92 | HR 82 | Ht 65.0 in | Wt 247.0 lb

## 2024-04-12 DIAGNOSIS — Z1231 Encounter for screening mammogram for malignant neoplasm of breast: Secondary | ICD-10-CM

## 2024-04-12 DIAGNOSIS — Z01419 Encounter for gynecological examination (general) (routine) without abnormal findings: Secondary | ICD-10-CM | POA: Diagnosis not present

## 2024-05-13 ENCOUNTER — Ambulatory Visit
Admission: RE | Admit: 2024-05-13 | Discharge: 2024-05-13 | Disposition: A | Discharge status: Home / Self Care | Source: Ambulatory Visit | Attending: Obstetrics | Admitting: Obstetrics

## 2024-05-13 DIAGNOSIS — Z1231 Encounter for screening mammogram for malignant neoplasm of breast: Secondary | ICD-10-CM | POA: Insufficient documentation

## 2024-05-23 ENCOUNTER — Ambulatory Visit: Admitting: Dermatology

## 2024-05-23 ENCOUNTER — Encounter: Payer: Self-pay | Admitting: Dermatology

## 2024-05-23 VITALS — BP 157/107 | HR 81

## 2024-05-23 DIAGNOSIS — L80 Vitiligo: Secondary | ICD-10-CM | POA: Diagnosis not present

## 2024-05-23 DIAGNOSIS — L308 Other specified dermatitis: Secondary | ICD-10-CM | POA: Diagnosis not present

## 2024-05-23 DIAGNOSIS — L700 Acne vulgaris: Secondary | ICD-10-CM

## 2024-05-23 DIAGNOSIS — L309 Dermatitis, unspecified: Secondary | ICD-10-CM

## 2024-05-23 MED ORDER — CLINDAMYCIN PHOS-BENZOYL PEROX 1.2-3.75 % EX GEL
1.0000 | Freq: Every morning | CUTANEOUS | 6 refills | Status: DC
Start: 2024-05-23 — End: 2024-06-13

## 2024-05-23 MED ORDER — SPIRONOLACTONE 100 MG PO TABS
100.0000 mg | ORAL_TABLET | Freq: Every day | ORAL | 6 refills | Status: AC
Start: 2024-05-23 — End: ?

## 2024-05-23 NOTE — Progress Notes (Signed)
 Follow-Up Visit   Subjective  Elizabeth Bridges is a 43 y.o. female who presents for the following: Acne and Rosacea, Hand dermatitis and Vitiligo  Patient present today for follow up visit for acne and rosacea. Patient was last evaluated on 01/13/24. At this visit patient was prescribed sodium sulfacetamide  wash. Patient was recommeded azelaic acid lotion(The Ordinary). Patient recommended to continue tretinoin  5 nights a week. Recommneded neutrogena ulta sheer mineral sunscreen. Patient reports feeling some dryness with using the tretinoin  but goes away easily with moisturizer. Patient reports sxs are unchanged. Patient denies medication changes.  The following portions of the chart were reviewed this encounter and updated as appropriate: medications, allergies, medical history  Review of Systems:  No other skin or systemic complaints except as noted in HPI or Assessment and Plan.  Objective  Well appearing patient in no apparent distress; mood and affect are within normal limits.   focused examination was performed of the following areas: Face  Relevant exam findings are noted in the Assessment and Plan.           Assessment & Plan  Assessment and Plan Acne vulgaris Acne poorly controlled with current topicals. Oral spironolactone considered due to hormonal component. Safe with Depo shot. Monitor potassium. - Prescribe spironolactone for nighttime use. - Prescribe Onexton for morning application. - Continue tretinoin  at night as tolerated. - Provide Evensiclophate samples for nighttime use. - Send spironolactone prescription to pharmacy. - Monitor potassium intake, perform baseline labs, follow-up in one month if needed.  Hand dermatitis Improving with clobetasol  and tacrolimus . Occasional flares managed well. - Continue clobetasol  and tacrolimus  as needed. - Provide Neutrogena's [place] hand cream samples for post-wash use.  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 Transitioning to tacrolimus  alone. - Continue tacrolimus  daily after clobetasol  course. - Monitor for new vitiligo spots and report if she occurs.  ACNE VULGARIS   Related Medications spironolactone (ALDACTONE) 100 MG tablet Take 1 tablet (100 mg total) by mouth daily. Clindamycin Phos-Benzoyl Perox (ONEXTON) 1.2-3.75 % GEL Apply 1 Application topically in the morning. Apply topically in the morning after washing. Patient present today for follow up visit for hand dermatitis. Patient was last evaluated on 01/13/24. At this visit patient was prescribed clobetasol  and tacrolimus . Patient reports that it is getting a little better but still has flares once a month. Patient reports sxs are better. Patient denies medication changes.  The following portions of the chart were reviewed this encounter and updated as appropriate: medications, allergies, medical history  Review of Systems:  No other skin or systemic complaints except as noted in HPI or Assessment and Plan.  Objective  Well appearing patient in no apparent distress; mood and affect are within normal limits.  focused examination was performed of the following areas: Hands  Relevant exam findings are noted in the Assessment and Plan.    Assessment & Plan Continue previous regimen Recommended Neutrogena [place] hand cream for use daily    Patient present today for follow up visit for Vitiligo. Patient was last evaluated on 01/13/24. At this visit patient was recommended to continue clobetasol  ointment and tacrolimus  ointment. Patient reports that she has not been fully examining the groin area for visible changes. Patient reports sxs are better. Patient denies medication changes.  The following portions of the chart were reviewed this encounter and updated as appropriate: medications, allergies, medical history  Review of Systems:  No other skin or systemic complaints except as noted in HPI or Assessment and  Plan.  Objective  Well appearing patient in no apparent distress; mood and affect are within normal limits.  A focused examination was performed of the following areas: groin  Relevant exam findings are noted in the Assessment and Plan.    Assessment & Plan  Continue previous regimen     Return in about 5 months (around 10/22/2024) for follow up for acne & rosasea, hand dermatitis and vitiligo.  LILLETTE Lyle Cords, am acting as Neurosurgeon for [employer], DO.   Documentation: I have reviewed the above documentation for accuracy and completeness, and I agree with the above.  Delon Lenis, DO

## 2024-05-23 NOTE — Patient Instructions (Addendum)
 VISIT SUMMARY:  Today, you came in for a follow-up on your skin conditions, including acne, hand dermatitis, and vitiligo. We discussed your current treatments and made some adjustments to better manage your symptoms.  YOUR PLAN:  -ACNE VULGARIS: Acne vulgaris is a common skin condition that occurs when hair follicles become clogged with oil and dead skin cells. Since your acne has not improved with the current treatments, we are adding oral spironolactone, which can help with hormonal acne.  -You should take spironolactone 100mg  tablet at night.  -We are also prescribing Onexton for morning use and providing samples of Avene Cicalfate for nighttime use.  -Continue using tretinoin  at night as tolerated.   -HAND DERMATITIS: Hand dermatitis is a condition that causes inflammation of the skin on your hands. Your condition is improving with the use of clobetasol  and tacrolimus , but you still experience occasional flares.  -Continue using clobetasol  and tacrolimus  as needed. We are also providing samples of [employer]'s [place] hand cream for you to use after washing your hands.  -GENITAL VITILIGO: Vitiligo is a condition where the skin loses its pigment cells, leading to white patches. Your genital vitiligo is stable with no new areas of depigmentation. Continue using tacrolimus  daily after finishing the clobetasol  course. Please monitor for any new spots and report them if they occur.  INSTRUCTIONS:  Please follow up in one month if needed for your acne treatment. Continue with your current treatments for hand dermatitis and vitiligo, and report any new vitiligo spots if they occur.     Important Information  Due to recent changes in healthcare laws, you may see results of your pathology and/or laboratory studies on MyChart before the doctors have had a chance to review them. We understand that in some cases there may be results that are confusing or concerning to you. Please understand that  not all results are received at the same time and often the doctors may need to interpret multiple results in order to provide you with the best plan of care or course of treatment. Therefore, we ask that you please give us  2 business days to thoroughly review all your results before contacting the office for clarification. Should we see a critical lab result, you will be contacted sooner.   If You Need Anything After Your Visit  If you have any questions or concerns for your doctor, please call our main line at [phone] If no one answers, please leave a voicemail as directed and we will return your call as soon as possible. Messages left after 4 pm will be answered the following business day.   You may also send us  a message via MyChart. We typically respond to MyChart messages within 1-2 business days.  For prescription refills, please ask your pharmacy to contact our office. Our fax number is [phone].  If you have an urgent issue when the clinic is closed that cannot wait until the next business day, you can page your doctor at the number below.    Please note that while we do our best to be available for urgent issues outside of office hours, we are not available 24/7.   If you have an urgent issue and are unable to reach us , you may choose to seek medical care at your doctor's office, retail clinic, urgent care center, or emergency room.  If you have a medical emergency, please immediately call 911 or go to the emergency department. In the event of inclement weather, please call our main line  at [phone] for an update on the status of any delays or closures.  Dermatology Medication Tips: Please keep the boxes that topical medications come in in order to help keep track of the instructions about where and how to use these. Pharmacies typically print the medication instructions only on the boxes and not directly on the medication tubes.   If your medication is too expensive,  please contact our office at [phone] or send us  a message through MyChart.   We are unable to tell what your co-pay for medications will be in advance as this is different depending on your insurance coverage. However, we may be able to find a substitute medication at lower cost or fill out paperwork to get insurance to cover a needed medication.   If a prior authorization is required to get your medication covered by your insurance company, please allow us  1-2 business days to complete this process.  Drug prices often vary depending on where the prescription is filled and some pharmacies may offer cheaper prices.  The website www.goodrx.com contains coupons for medications through different pharmacies. The prices here do not account for what the cost may be with help from insurance (it may be cheaper with your insurance), but the website can give you the price if you did not use any insurance.  - You can print the associated coupon and take it with your prescription to the pharmacy.  - You may also stop by our office during regular business hours and pick up a GoodRx coupon card.  - If you need your prescription sent electronically to a different pharmacy, notify our office through [facility] or by phone at [phone]

## 2024-06-08 ENCOUNTER — Encounter: Payer: Self-pay | Admitting: Family

## 2024-06-08 ENCOUNTER — Ambulatory Visit: Admitting: Family

## 2024-06-08 VITALS — BP 130/100 | HR 82 | Temp 98.8°F | Ht 65.0 in | Wt 246.0 lb

## 2024-06-08 DIAGNOSIS — Z0000 Encounter for general adult medical examination without abnormal findings: Secondary | ICD-10-CM | POA: Diagnosis not present

## 2024-06-08 DIAGNOSIS — E785 Hyperlipidemia, unspecified: Secondary | ICD-10-CM

## 2024-06-08 DIAGNOSIS — E669 Obesity, unspecified: Secondary | ICD-10-CM

## 2024-06-08 DIAGNOSIS — R030 Elevated blood-pressure reading, without diagnosis of hypertension: Secondary | ICD-10-CM

## 2024-06-08 DIAGNOSIS — Z23 Encounter for immunization: Secondary | ICD-10-CM | POA: Diagnosis not present

## 2024-06-08 DIAGNOSIS — R7303 Prediabetes: Secondary | ICD-10-CM

## 2024-06-08 DIAGNOSIS — Z13220 Encounter for screening for lipoid disorders: Secondary | ICD-10-CM

## 2024-06-08 DIAGNOSIS — Z79899 Other long term (current) drug therapy: Secondary | ICD-10-CM

## 2024-06-08 DIAGNOSIS — Z202 Contact with and (suspected) exposure to infections with a predominantly sexual mode of transmission: Secondary | ICD-10-CM

## 2024-06-08 DIAGNOSIS — E559 Vitamin D deficiency, unspecified: Secondary | ICD-10-CM

## 2024-06-08 LAB — BASIC METABOLIC PANEL WITH GFR
BUN: 8 mg/dL (ref 6–23)
CO2: 26 meq/L (ref 19–32)
Calcium: 9.4 mg/dL (ref 8.4–10.5)
Chloride: 103 meq/L (ref 96–112)
Creatinine, Ser: 0.78 mg/dL (ref 0.40–1.20)
GFR: 92.83 mL/min (ref >60.00)
Glucose, Bld: 99 mg/dL (ref 70–99)
Potassium: 4 meq/L (ref 3.5–5.1)
Sodium: 138 meq/L (ref 135–145)

## 2024-06-08 LAB — TSH: TSH: 1.42 u[IU]/mL (ref 0.35–5.50)

## 2024-06-08 LAB — CBC
HCT: 41.2 % (ref 36.0–46.0)
Hemoglobin: 13.4 g/dL (ref 12.0–15.0)
MCHC: 32.6 g/dL (ref 30.0–36.0)
MCV: 90.7 fl (ref 78.0–100.0)
Platelets: 200 K/uL (ref 150.0–400.0)
RBC: 4.54 Mil/uL (ref 3.87–5.11)
RDW: 13.7 % (ref 11.5–15.5)
WBC: 5 K/uL (ref 4.0–10.5)

## 2024-06-08 LAB — LIPID PANEL
Cholesterol: 191 mg/dL (ref 0–200)
HDL: 52.4 mg/dL (ref >39.00)
LDL Cholesterol: 124 mg/dL — ABNORMAL HIGH (ref 0–99)
NonHDL: 138.45
Total CHOL/HDL Ratio: 4
Triglycerides: 71 mg/dL (ref 0.0–149.0)
VLDL: 14.2 mg/dL (ref 0.0–40.0)

## 2024-06-08 LAB — HEMOGLOBIN A1C: Hgb A1c MFr Bld: 5.8 % (ref 4.6–6.5)

## 2024-06-08 LAB — VITAMIN D 25 HYDROXY (VIT D DEFICIENCY, FRACTURES): VITD: 40.03 ng/mL (ref 30.00–100.00)

## 2024-06-08 NOTE — Progress Notes (Signed)
 Subjective:  Patient ID: Elizabeth Bridges, female    DOB: [birth year]  Age: 43 y.o. MRN: [id number]  Patient Care Team: Corwin Antu, FNP as PCP - General (Family Medicine) Connell Davies, MD (Inactive) as Referring Physician (Obstetrics and Gynecology)   CC:  Chief Complaint  Patient presents with   Annual Exam    HPI Elizabeth Bridges is a 43 y.o. female who presents today for an annual physical exam. She reports consuming a general diet. Joined the gym recently, tries to go at least 3x a week She generally feels well. She reports sleeping fairly well, she does take melatonin prn when she has had an anxious day or is feeing stressed to help her fall asleep She does not have additional problems to discuss today.   Vision:Within last year Dental:Receives regular dental care  Mammogram: 05/13/24 Last pap: 04/30/22  Pt is without acute concerns.   Discussed the use of AI scribe software for clinical note transcription with the patient, who gave verbal consent to proceed.  History of Present Illness Elizabeth Bridges is a 43 year old female who presents for an annual physical exam.  Blood pressure readings at home are typically in the high 120s to 130s over low 80s, but she is higher during doctor visits. She checks her blood pressure infrequently, usually a few days after doctor visits.  She experiences knee pain, particularly when climbing stairs, which she suspects might be related to arthritis. The pain does not occur when walking or descending stairs, and her knee does not give out.  She has been prescribed spironolactone for cystic acne, which is believed to be hormonal. She has not started the medication yet due to concerns about dietary potassium restrictions. She is also on Depo-Provera  for contraception.  She exercises regularly, attending the gym at least three times a week and working out at home if unable to go to the gym. She has been trying to adjust her diet, although she  finds it challenging.  Her sleep is variable; some nights she sleeps well, while on others she wakes up with her mind racing. She has tried melatonin, which helps a little.  No symptoms or concerns regarding STDs however she would like to be tested as screening.   Advanced Directives Patient does not have advanced directives    DEPRESSION SCREENING    06/08/2024    9:20 AM 04/12/2024    8:17 AM 04/08/2023    9:06 AM 01/02/2023   11:32 AM 09/30/2022    8:41 AM 09/15/2022    2:25 PM 04/30/2022    8:16 AM  PHQ 2/9 Scores  PHQ - 2 Score 0 0 0 0 0 0 0  PHQ- 9 Score 0    0 0      ROS: Negative unless specifically indicated above in HPI.    Current Outpatient Medications:    Clindamycin Phos-Benzoyl Perox (ONEXTON) 1.2-3.75 % GEL, Apply 1 Application topically in the morning. Apply topically in the morning after washing., Disp: 50 g, Rfl: 6   clobetasol  ointment (TEMOVATE ) 0.05 %, Apply 1 Application topically 2 (two) times daily. Apply to affected areas twice daily for 2 weeks - alternate with Tacrolimus  every 2 weeks, Disp: 60 g, Rfl: 2   cyclobenzaprine  (FLEXERIL ) 10 MG tablet, Take 1 tablet (10 mg total) by mouth 3 (three) times daily as needed for muscle spasms., Disp: 30 tablet, Rfl: 0   medroxyPROGESTERone  (DEPO-PROVERA ) 150 MG/ML injection, Inject 1 mL (150 mg total) into  the muscle every 3 (three) months., Disp: 1 mL, Rfl: 0   Multiple Vitamin (MULTIVITAMIN) capsule, Take 1 capsule by mouth daily., Disp: , Rfl:    naproxen  (NAPROSYN ) 500 MG tablet, Take one po twice daily prn muscle pain, Disp: 30 tablet, Rfl: 0   spironolactone (ALDACTONE) 100 MG tablet, Take 1 tablet (100 mg total) by mouth daily., Disp: 30 tablet, Rfl: 6   Sulfacetamide  Sodium-Sulfur  (SULFACLEANSE 8/4) 8-4 % SUSP, Use to wash face morning and night, Disp: 177 mL, Rfl: 2   tacrolimus  (PROTOPIC ) 0.1 % ointment, APPLY TO AFFECTED AREAS TWICE DAILY FOR 2 WEEKS - ALTERNATE WITH CLOBETASOL  EVERY 2 WEEKS, Disp: 60 g, Rfl:  5   tretinoin  (RETIN-A ) 0.025 % cream, Apply topically at bedtime. Apply pea size amount to face for acne 3 nights per week (Monday, Wednesday, Friday), Disp: 45 g, Rfl: 3    Objective:    BP (!) 140/100 (BP Location: Left Arm, Patient Position: Sitting, Cuff Size: Large)   Pulse 82   Temp 98.8 F (37.1 C) (Temporal)   Ht 5' 5 (1.651 m)   Wt 246 lb (111.6 kg)   SpO2 96%   BMI 40.94 kg/m   BP Readings from Last 3 Encounters:  06/08/24 (!) 140/100  05/23/24 (!) 157/107  04/12/24 (!) 140/92   Wt Readings from Last 3 Encounters:  06/08/24 246 lb (111.6 kg)  04/12/24 247 lb (112 kg)  03/28/24 247 lb 8 oz (112.3 kg)      Physical Exam Vitals reviewed.  Constitutional:      General: She is not in acute distress.    Appearance: Normal appearance. She is obese. She is not ill-appearing.  HENT:     Head: Normocephalic.     Right Ear: Tympanic membrane normal.     Left Ear: Tympanic membrane normal.     Nose: Nose normal.     Mouth/Throat:     Mouth: Mucous membranes are moist.  Eyes:     Extraocular Movements: Extraocular movements intact.     Pupils: Pupils are equal, round, and reactive to light.  Neck:     Comments: Acanthoses nigrans  Cardiovascular:     Rate and Rhythm: Normal rate and regular rhythm.  Pulmonary:     Effort: Pulmonary effort is normal.     Breath sounds: Normal breath sounds.  Abdominal:     Tenderness: There is no guarding or rebound.  Musculoskeletal:        General: Normal range of motion.     Cervical back: Normal range of motion.  Skin:    General: Skin is warm.     Capillary Refill: Capillary refill takes less than 2 seconds.  Neurological:     General: No focal deficit present.     Mental Status: She is alert.  Psychiatric:        Mood and Affect: Mood normal.        Behavior: Behavior normal.        Thought Content: Thought content normal.        Judgment: Judgment normal.       Results RADIOLOGY Mammogram:  Normal  PATHOLOGY Pap smear: Negative      Assessment & Plan:   Assessment and Plan Assessment & Plan Adult Wellness Visit Routine adult wellness visit with consideration of white coat syndrome due to elevated office blood pressure readings. Home readings are in the high 120s to 130s/low 80s. Mammogram and Pap smear were normal in 2023. No family history  of colon cancer. Engages in regular exercise and dietary modifications. Up to date with eye and dental exams. - Provide blood pressure log for home monitoring. - Order basic labs including vitamin D  and A1c. - Discuss colonoscopy screening to start at age 25. - Encourage continued exercise and dietary modifications. -Patient Counseling(The following topics were reviewed):  Preventative care handout given to pt  Health maintenance and immunizations reviewed. Please refer to Health maintenance section. Pt advised on safe sex, wearing seatbelts in car, and proper nutrition labwork ordered today for annual Dental health: Discussed importance of regular tooth brushing, flossing, and dental visits.   Hypertension Office blood pressure readings elevated at 140/92, 157/107, and 140/100. Home readings suggest possible white coat syndrome. - Instruct to monitor blood pressure at home daily for one week, sitting for 5-10 minutes before checking. - Review home blood pressure readings via MyChart in one week. - Schedule six-month follow-up for blood pressure check.  Knee Pain, Suspected Osteoarthritis Knee pain likely due to osteoarthritis, exacerbated by climbing stairs. No giving out or extreme pain. Weight training and exercise beneficial for knee strengthening. - Recommend wearing a compression sleeve during activities. - Advise use of ibuprofen  for inflammation if needed. Voltaren also a good option prn - Encourage continued weight training and exercise.  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 Concerns about dietary potassium restriction due to spironolactone. Discussed potential impact on blood pressure and dietary modifications to manage potassium intake. Spironolactone may also help with blood pressure. - Start spironolactone as prescribed by dermatologist. - Monitor potassium levels with labs in three months. - Provide dietary guidance to limit high potassium foods.  Immunization Counseling and Administration Discussed HPV vaccination, recommended up to age 20. She is sexually active and interested in receiving the vaccine. - Administer HPV vaccine series (three doses) with first dose today   Sleep Disturbance Occasional sleep disturbances with difficulty sleeping through the night. Melatonin provides some relief. - Advise use of melatonin as needed for sleep disturbances.          Follow-up: Return in about 6 months (around 12/07/2024) for f/u blood pressure.   Ginger Patrick, FNP

## 2024-06-09 LAB — HIV ANTIBODY (ROUTINE TESTING W REFLEX)
HIV 1&2 Ab, 4th Generation: NONREACTIVE
HIV FINAL INTERPRETATION: NEGATIVE

## 2024-06-09 LAB — RPR: RPR Ser Ql: NONREACTIVE

## 2024-06-09 LAB — HEPATITIS C ANTIBODY: Hepatitis C Ab: NONREACTIVE

## 2024-06-10 LAB — CHLAMYDIA/GONOCOCCUS/TRICHOMONAS, NAA
Chlamydia by NAA: NEGATIVE
Gonococcus by NAA: NEGATIVE
Trich vag by NAA: NEGATIVE

## 2024-06-11 LAB — HSV 1 AND 2 AB, IGG
HSV 1 Glycoprotein G Ab, IgG: NONREACTIVE
HSV 2 IgG, Type Spec: REACTIVE — AB

## 2024-06-13 ENCOUNTER — Other Ambulatory Visit: Payer: Self-pay

## 2024-06-13 DIAGNOSIS — L700 Acne vulgaris: Secondary | ICD-10-CM

## 2024-06-13 MED ORDER — CLINDAMYCIN PHOS-BENZOYL PEROX 1.2-3.75 % EX GEL: 1.0000 | Freq: Every morning | CUTANEOUS | 6 refills | Status: AC

## 2024-06-14 ENCOUNTER — Ambulatory Visit: Payer: Self-pay | Admitting: Family

## 2024-06-14 DIAGNOSIS — B009 Herpesviral infection, unspecified: Secondary | ICD-10-CM | POA: Insufficient documentation

## 2024-06-21 ENCOUNTER — Ambulatory Visit

## 2024-06-21 VITALS — BP 112/77 | HR 83 | Ht 65.0 in | Wt 236.0 lb

## 2024-06-21 DIAGNOSIS — Z3042 Encounter for surveillance of injectable contraceptive: Secondary | ICD-10-CM | POA: Diagnosis not present

## 2024-06-21 MED ORDER — MEDROXYPROGESTERONE ACETATE 150 MG/ML IM SUSY
150.0000 mg | PREFILLED_SYRINGE | Freq: Once | INTRAMUSCULAR | Status: AC
Start: 2024-06-21 — End: 2024-06-21
  Administered 2024-06-21: 150 mg via INTRAMUSCULAR

## 2024-06-21 NOTE — Progress Notes (Signed)
    NURSE VISIT NOTE  Subjective:    Patient ID: Elizabeth Bridges, female    DOB: Jul 26, 1981, 43 y.o.   MRN: [id number]  HPI  Patient is a 43 y.o. [id number] female who presents for depo provera  injection.   Objective:    BP 112/77   Pulse 83   Ht 5' 5 (1.651 m)   Wt 236 lb (107 kg)   BMI 39.27 kg/m   Last Annual: 04/13/23. Last pap: 04/30/22. Last Depo-Provera : 03/28/2024. Side Effects if any: none. Serum HCG indicated? No . Depo-Provera  150 mg IM given by: Annalee Sanders, CMA. Site: Left Deltoid  Lab Review  No results found for any visits on 06/21/24.  Assessment:   1. Encounter for Depo-Provera  contraception      Plan:   Next appointment due between JAN13-JAN27     Annalee VEAR Sanders, CMA

## 2024-07-07 DIAGNOSIS — H35463 Secondary vitreoretinal degeneration, bilateral: Secondary | ICD-10-CM | POA: Diagnosis not present

## 2024-07-07 DIAGNOSIS — H43823 Vitreomacular adhesion, bilateral: Secondary | ICD-10-CM | POA: Diagnosis not present

## 2024-08-09 ENCOUNTER — Ambulatory Visit (INDEPENDENT_AMBULATORY_CARE_PROVIDER_SITE_OTHER)

## 2024-08-09 DIAGNOSIS — Z23 Encounter for immunization: Secondary | ICD-10-CM | POA: Diagnosis not present

## 2024-08-09 NOTE — Progress Notes (Signed)
 Per orders of Ginger Patrick, NP, injection of HPV given by Nellie Hummer in Left deltoid Patient tolerated injection well.

## 2024-09-08 ENCOUNTER — Other Ambulatory Visit

## 2024-09-08 DIAGNOSIS — Z79899 Other long term (current) drug therapy: Secondary | ICD-10-CM | POA: Diagnosis not present

## 2024-09-08 LAB — BASIC METABOLIC PANEL WITH GFR
BUN: 10 mg/dL (ref 6–23)
CO2: 28 meq/L (ref 19–32)
Calcium: 9.8 mg/dL (ref 8.4–10.5)
Chloride: 103 meq/L (ref 96–112)
Creatinine, Ser: 0.92 mg/dL (ref 0.40–1.20)
GFR: 76.02 mL/min
Glucose, Bld: 85 mg/dL (ref 70–99)
Potassium: 3.7 meq/L (ref 3.5–5.1)
Sodium: 138 meq/L (ref 135–145)

## 2024-09-12 NOTE — Progress Notes (Unsigned)
" ° ° °  NURSE VISIT NOTE  Subjective:    Patient ID: Elizabeth Bridges, female    DOB: June 27, 1981, 44 y.o.   MRN: [id number]  HPI  Patient is a 44 y.o. [id number] female who presents for depo provera  injection.   Objective:    There were no vitals taken for this visit.  Last Annual: 04/12/24. Last pap: 04/30/22. Last Depo-Provera : 06/21/24. Side Effects if any: none. Serum HCG indicated? No . Depo-Provera  150 mg IM given by: Mathis Getting, CMA. Site: Right Deltoid  Lab Review  No results found for any visits on 09/13/24.  Assessment:   1. Encounter for management and injection of depo-Provera       Plan:   Next appointment due between 11/29/24 and 12/13/24.    Mathis LITTIE Getting, CMA  "

## 2024-09-13 ENCOUNTER — Ambulatory Visit (INDEPENDENT_AMBULATORY_CARE_PROVIDER_SITE_OTHER)

## 2024-09-13 VITALS — BP 127/80 | HR 90 | Ht 65.0 in | Wt 237.3 lb

## 2024-09-13 DIAGNOSIS — Z3042 Encounter for surveillance of injectable contraceptive: Secondary | ICD-10-CM | POA: Diagnosis not present

## 2024-09-13 MED ORDER — MEDROXYPROGESTERONE ACETATE 150 MG/ML IM SUSP
150.0000 mg | Freq: Once | INTRAMUSCULAR | Status: AC
  Administered 2024-09-13: 150 mg via INTRAMUSCULAR

## 2024-09-13 NOTE — Patient Instructions (Signed)

## 2024-10-19 ENCOUNTER — Ambulatory Visit: Admitting: Dermatology

## 2024-12-06 ENCOUNTER — Ambulatory Visit

## 2024-12-07 ENCOUNTER — Ambulatory Visit: Admitting: Family

## 2025-03-08 ENCOUNTER — Ambulatory Visit
